# Patient Record
Sex: Female | Born: 1960 | Race: Black or African American | Hispanic: No | Marital: Single | Smoking: Never smoker
Health system: Southern US, Community
[De-identification: ages and names within clinical notes are randomized; demographics above are authoritative.]

## PROBLEM LIST (undated history)

## (undated) DIAGNOSIS — I10 Essential (primary) hypertension: Secondary | ICD-10-CM

## (undated) DIAGNOSIS — D649 Anemia, unspecified: Secondary | ICD-10-CM

## (undated) DIAGNOSIS — F99 Mental disorder, not otherwise specified: Secondary | ICD-10-CM

## (undated) DIAGNOSIS — R0602 Shortness of breath: Secondary | ICD-10-CM

## (undated) DIAGNOSIS — K219 Gastro-esophageal reflux disease without esophagitis: Secondary | ICD-10-CM

## (undated) DIAGNOSIS — N760 Acute vaginitis: Secondary | ICD-10-CM

## (undated) DIAGNOSIS — Z9289 Personal history of other medical treatment: Secondary | ICD-10-CM

## (undated) DIAGNOSIS — M1990 Unspecified osteoarthritis, unspecified site: Secondary | ICD-10-CM

## (undated) DIAGNOSIS — B9689 Other specified bacterial agents as the cause of diseases classified elsewhere: Secondary | ICD-10-CM

## (undated) DIAGNOSIS — F419 Anxiety disorder, unspecified: Secondary | ICD-10-CM

## (undated) DIAGNOSIS — R51 Headache: Secondary | ICD-10-CM

## (undated) HISTORY — DX: Unspecified osteoarthritis, unspecified site: M19.90

## (undated) HISTORY — PX: TUBAL LIGATION: SHX77

## (undated) HISTORY — PX: OTHER SURGICAL HISTORY: SHX169

## (undated) HISTORY — DX: Essential (primary) hypertension: I10

## (undated) HISTORY — DX: Headache: R51

## (undated) HISTORY — DX: Anemia, unspecified: D64.9

---

## 1997-11-03 ENCOUNTER — Emergency Department (HOSPITAL_COMMUNITY): Admission: EM | Admit: 1997-11-03 | Discharge: 1997-11-03 | Payer: Self-pay | Admitting: Family Medicine

## 2001-05-24 ENCOUNTER — Ambulatory Visit: Admission: RE | Admit: 2001-05-24 | Discharge: 2001-05-24 | Payer: Self-pay | Admitting: Family Medicine

## 2001-05-24 ENCOUNTER — Encounter: Payer: Self-pay | Admitting: Family Medicine

## 2006-08-26 ENCOUNTER — Encounter: Admission: RE | Admit: 2006-08-26 | Discharge: 2006-08-26 | Payer: Self-pay | Admitting: Emergency Medicine

## 2009-01-30 ENCOUNTER — Encounter: Admission: RE | Admit: 2009-01-30 | Discharge: 2009-01-30 | Payer: Self-pay | Admitting: Family Medicine

## 2010-03-30 ENCOUNTER — Encounter: Payer: Self-pay | Admitting: Emergency Medicine

## 2010-05-08 ENCOUNTER — Other Ambulatory Visit (HOSPITAL_COMMUNITY)
Admission: RE | Admit: 2010-05-08 | Discharge: 2010-05-08 | Disposition: A | Discharge status: Home / Self Care | Payer: PRIVATE HEALTH INSURANCE | Source: Ambulatory Visit | Attending: Obstetrics and Gynecology | Admitting: Obstetrics and Gynecology

## 2010-05-08 DIAGNOSIS — Z01419 Encounter for gynecological examination (general) (routine) without abnormal findings: Secondary | ICD-10-CM | POA: Insufficient documentation

## 2010-05-08 DIAGNOSIS — Z113 Encounter for screening for infections with a predominantly sexual mode of transmission: Secondary | ICD-10-CM | POA: Insufficient documentation

## 2011-03-06 ENCOUNTER — Other Ambulatory Visit: Payer: Self-pay | Admitting: Family Medicine

## 2011-03-08 ENCOUNTER — Encounter: Payer: Self-pay | Admitting: Family Medicine

## 2011-03-08 ENCOUNTER — Other Ambulatory Visit: Payer: Self-pay | Admitting: Family Medicine

## 2011-03-08 NOTE — H&P (Signed)
Patient is unable to tolerate oral iron, due to GI upset and hair loss. She has chronic iron deficiency anemia. Recent Hgb was 7.6 on 02/27/2011. Iron level was 16.

## 2011-03-13 ENCOUNTER — Encounter (HOSPITAL_COMMUNITY): Payer: PRIVATE HEALTH INSURANCE

## 2011-07-20 ENCOUNTER — Other Ambulatory Visit: Payer: Self-pay | Admitting: Family Medicine

## 2011-08-17 DIAGNOSIS — K219 Gastro-esophageal reflux disease without esophagitis: Secondary | ICD-10-CM | POA: Insufficient documentation

## 2011-08-17 DIAGNOSIS — I10 Essential (primary) hypertension: Secondary | ICD-10-CM | POA: Insufficient documentation

## 2011-09-07 DIAGNOSIS — Z9289 Personal history of other medical treatment: Secondary | ICD-10-CM

## 2011-09-07 HISTORY — DX: Personal history of other medical treatment: Z92.89

## 2011-09-14 ENCOUNTER — Encounter (HOSPITAL_COMMUNITY): Payer: Self-pay | Admitting: *Deleted

## 2011-09-14 ENCOUNTER — Emergency Department (HOSPITAL_COMMUNITY)
Admission: EM | Admit: 2011-09-14 | Discharge: 2011-09-15 | Disposition: A | Discharge status: Home / Self Care | Payer: PRIVATE HEALTH INSURANCE | Attending: Emergency Medicine | Admitting: Emergency Medicine

## 2011-09-14 DIAGNOSIS — D649 Anemia, unspecified: Secondary | ICD-10-CM | POA: Insufficient documentation

## 2011-09-14 DIAGNOSIS — I10 Essential (primary) hypertension: Secondary | ICD-10-CM | POA: Insufficient documentation

## 2011-09-14 DIAGNOSIS — R0602 Shortness of breath: Secondary | ICD-10-CM | POA: Insufficient documentation

## 2011-09-14 LAB — COMPREHENSIVE METABOLIC PANEL WITH GFR
ALT: 10 U/L (ref 0–35)
AST: 17 U/L (ref 0–37)
Albumin: 3.1 g/dL — ABNORMAL LOW (ref 3.5–5.2)
Alkaline Phosphatase: 132 U/L — ABNORMAL HIGH (ref 39–117)
BUN: 11 mg/dL (ref 6–23)
CO2: 25 meq/L (ref 19–32)
Calcium: 9.5 mg/dL (ref 8.4–10.5)
Chloride: 100 meq/L (ref 96–112)
Creatinine, Ser: 0.65 mg/dL (ref 0.50–1.10)
GFR calc Af Amer: >90 mL/min
GFR calc non Af Amer: >90 mL/min
Glucose, Bld: 98 mg/dL (ref 70–99)
Potassium: 3.8 meq/L (ref 3.5–5.1)
Sodium: 134 meq/L — ABNORMAL LOW (ref 135–145)
Total Bilirubin: 0.3 mg/dL (ref 0.3–1.2)
Total Protein: 8.1 g/dL (ref 6.0–8.3)

## 2011-09-14 LAB — CBC WITH DIFFERENTIAL/PLATELET
Basophils Absolute: 0 10*3/uL (ref 0.0–0.1)
Basophils Relative: 0 % (ref 0–1)
Eosinophils Absolute: 0.1 10*3/uL (ref 0.0–0.7)
Eosinophils Relative: 1 % (ref 0–5)
HCT: 24.3 % — ABNORMAL LOW (ref 36.0–46.0)
Hemoglobin: 6.8 g/dL — CL (ref 12.0–15.0)
Lymphocytes Relative: 24 % (ref 12–46)
Lymphs Abs: 2.1 10*3/uL (ref 0.7–4.0)
MCH: 18.3 pg — ABNORMAL LOW (ref 26.0–34.0)
MCHC: 28 g/dL — ABNORMAL LOW (ref 30.0–36.0)
MCV: 65.5 fL — ABNORMAL LOW (ref 78.0–100.0)
Monocytes Absolute: 0.5 10*3/uL (ref 0.1–1.0)
Monocytes Relative: 6 % (ref 3–12)
Neutro Abs: 6.2 10*3/uL (ref 1.7–7.7)
Neutrophils Relative %: 69 % (ref 43–77)
Platelets: 249 10*3/uL (ref 150–400)
RBC: 3.71 MIL/uL — ABNORMAL LOW (ref 3.87–5.11)
RDW: 19.5 % — ABNORMAL HIGH (ref 11.5–15.5)
WBC: 8.9 10*3/uL (ref 4.0–10.5)

## 2011-09-14 LAB — PREPARE RBC (CROSSMATCH)

## 2011-09-14 MED ORDER — SODIUM CHLORIDE 0.9 % IV SOLN
Freq: Once | INTRAVENOUS | Status: AC
  Administered 2011-09-14: 20:00:00 via INTRAVENOUS

## 2011-09-14 NOTE — ED Notes (Signed)
Pt had blood works last Wednesday at her PCP's office (Dr. Harvie Heck Harris)---results came in today and was told that her Hgb is 6.3--- was advised to come to ED for Blood Transfusion. Presents to room A/Ox4, c/o some dizziness and SOB,  No other complaints.

## 2011-09-14 NOTE — ED Notes (Addendum)
Pt states "Dr. Tiburcio Pea' nurse called & told me to come here for an iron transfusion and to stay 1 night, they told me my iron was 6.3"; called SS & spoken with ED Charge RN, no one aware.Marland Kitchen

## 2011-09-14 NOTE — ED Notes (Signed)
Pt denies itching or shortness of breath at this time.

## 2011-09-15 NOTE — ED Provider Notes (Addendum)
History     CSN: [id number]  Arrival date & time 09/14/11  1729   First MD Initiated Contact with Patient 09/14/11 1927      Chief Complaint  Patient presents with  . Shortness of Breath    sent here from Dr. Leonides Sake' office for iron transfusion and 1 night stay    (Consider location/radiation/quality/duration/timing/severity/associated sxs/prior treatment) HPI Pt has history of heavy menstrual bleeding.  She saw her doctor last Wednesday and had blood work.  Pt was treated by PCP with meds and the vaginal bleeding stopped.  She was called back today and was told that she was anemic and needed a blood transfusion.  Pt was sent to the ED for further evaluation.  She denies shortness of breath or chest pain.  She has felt fatigued. Past Medical History  Diagnosis Date  . Hypertension   . Headache   . Anemia   . Arthritis     Past Surgical History  Procedure Date  . C-section x 2     No family history on file.  History  Substance Use Topics  . Smoking status: Never Smoker   . Smokeless tobacco: Not on file  . Alcohol Use: No    OB History    Grav Para Term Preterm Abortions TAB SAB Ect Mult Living                  Review of Systems  All other systems reviewed and are negative.    Allergies  Review of patient's allergies indicates no known allergies.  Home Medications   Current Outpatient Rx  Name Route Sig Dispense Refill  . AMLODIPINE BESYLATE-VALSARTAN 10-320 MG PO TABS Oral Take 1 tablet by mouth daily.    . FEOSOL PO Oral Take 1 tablet by mouth daily.    . CYCLOBENZAPRINE HCL 10 MG PO TABS Oral Take 10 mg by mouth at bedtime as needed. For muscle pain.    Marland Kitchen ESOMEPRAZOLE MAGNESIUM 40 MG PO CPDR Oral Take 40 mg by mouth daily as needed.     . NORGESTREL-ETHINYL ESTRADIOL 0.3-30 MG-MCG PO TABS Oral Take 1 tablet by mouth daily.    . TRANEXAMIC ACID 650 MG PO TABS Oral Take 1,300 mg by mouth 3 (three) times daily.      BP 159/67  Pulse 87  Temp  98.3 F (36.8 C) (Oral)  Resp 20  Wt 347 lb (157.398 kg)  SpO2 100%  LMP 08/15/2011  Physical Exam  Nursing note and vitals reviewed. Constitutional: She appears well-developed and well-nourished. No distress.       Obese   HENT:  Head: Normocephalic and atraumatic.  Right Ear: External ear normal.  Left Ear: External ear normal.  Eyes: Conjunctivae are normal. Right eye exhibits no discharge. Left eye exhibits no discharge. No scleral icterus.       Conjunctiva pale  Neck: Neck supple. No tracheal deviation present.  Cardiovascular: Normal rate, regular rhythm and intact distal pulses.   Pulmonary/Chest: Effort normal and breath sounds normal. No stridor. No respiratory distress. She has no wheezes. She has no rales.  Abdominal: Soft. Bowel sounds are normal. She exhibits no distension. There is no tenderness. There is no rebound and no guarding.  Musculoskeletal: She exhibits no edema and no tenderness.  Neurological: She is alert. She has normal strength. No sensory deficit. Cranial nerve deficit:  no gross defecits noted. She exhibits normal muscle tone. She displays no seizure activity. Coordination normal.  Skin: Skin is  warm and dry. No rash noted.  Psychiatric: She has a normal mood and affect.    ED Course  Procedures (including critical care time)  Rate: 79  Rhythm: normal sinus rhythm  QRS Axis: normal  Intervals: normal  ST/T Wave abnormalities: normal  Conduction Disutrbances:none  Narrative Interpretation:   Old EKG Reviewed: none available  Labs Reviewed  CBC WITH DIFFERENTIAL - Abnormal; Notable for the following:    RBC 3.71 (*)     Hemoglobin 6.8 (*)     HCT 24.3 (*)     MCV 65.5 (*)     MCH 18.3 (*)     MCHC 28.0 (*)     RDW 19.5 (*)     All other components within normal limits  COMPREHENSIVE METABOLIC PANEL - Abnormal; Notable for the following:    Sodium 134 (*)     Albumin 3.1 (*)     Alkaline Phosphatase 132 (*)     All other components  within normal limits  TYPE AND SCREEN  ABO/RH  PREPARE RBC (CROSSMATCH)   No results found.   1. Anemia       MDM  Pt with anemia associated with menorrhagia which has now stopped after treatment by her PCP.  Pt transfused 2 units of blood in the ED.  Tolerated well.  Will dc home to follow up with her PCP.        Celene Kras, MD 09/15/11 Rich Fuchs  Celene Kras, MD 10/07/11 1924  Celene Kras, MD 10/21/11 [phone]

## 2011-09-16 LAB — TYPE AND SCREEN
ABO/RH(D): O POS
Antibody Screen: NEGATIVE
Unit division: 0
Unit division: 0

## 2011-09-18 ENCOUNTER — Inpatient Hospital Stay (HOSPITAL_COMMUNITY): Admission: RE | Admit: 2011-09-18 | Payer: PRIVATE HEALTH INSURANCE | Source: Ambulatory Visit

## 2011-11-10 IMAGING — US US TRANSVAGINAL NON-OB
1 series · 14 of 25 positions shown · non-contrast
Comparison: None.

CLINICAL DATA: Heavy menstrual bleeding

TRANSABDOMINAL AND TRANSVAGINAL ULTRASOUND OF PELVIS
TECHNIQUE: Both transabdominal and transvaginal ultrasound
examinations of the pelvis were performed including evaluation of
the uterus, ovaries, adnexal regions, and pelvic cul-de-sac.

[Series 1: us transvaginal non-ob · 0.30mm/px · 14 of 44 slices shown]
[im 1/44]
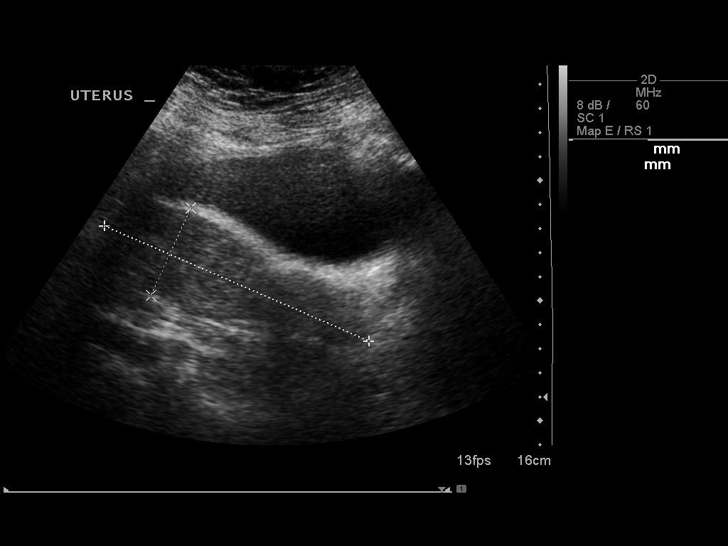
[im 4/44]
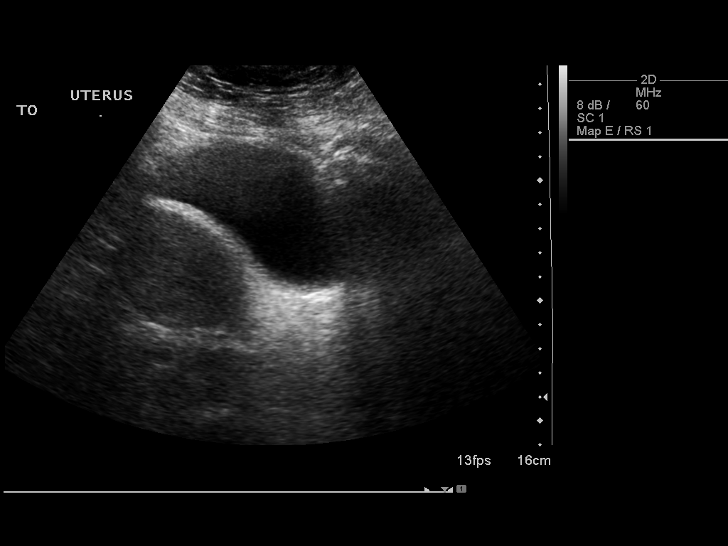
[im 8/44]
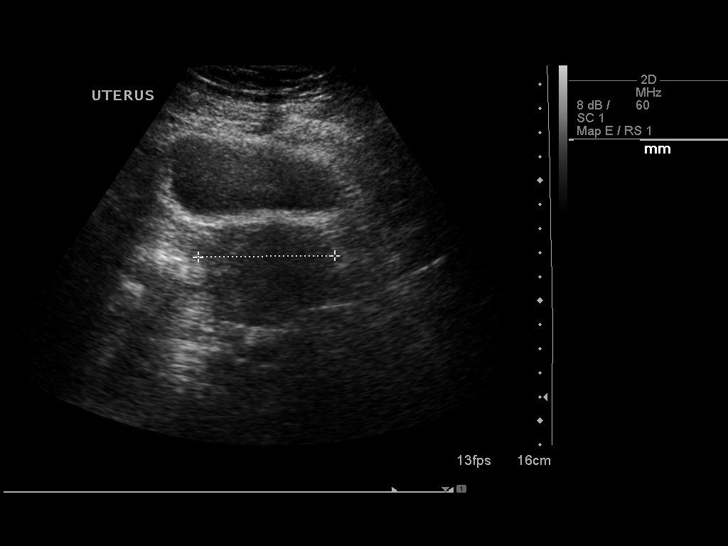
[im 11/44]
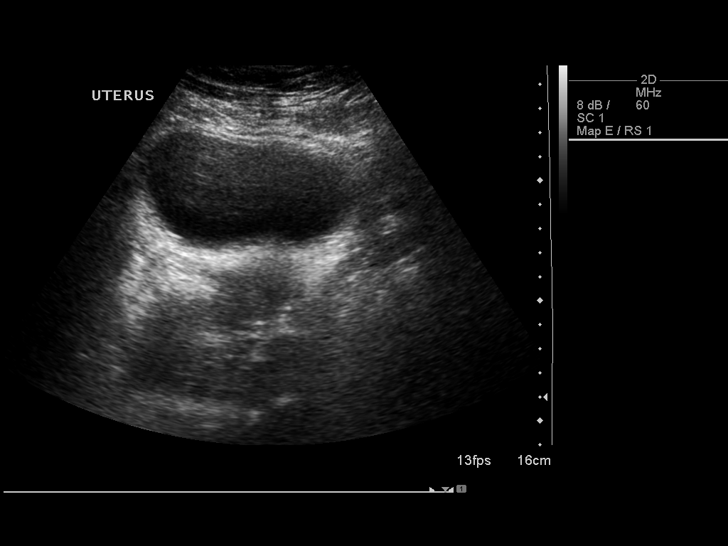
[im 15/44]
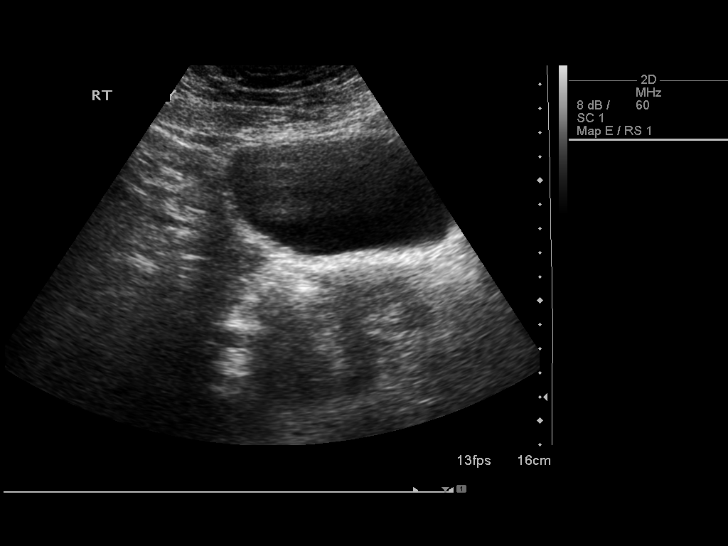
[im 17/44]
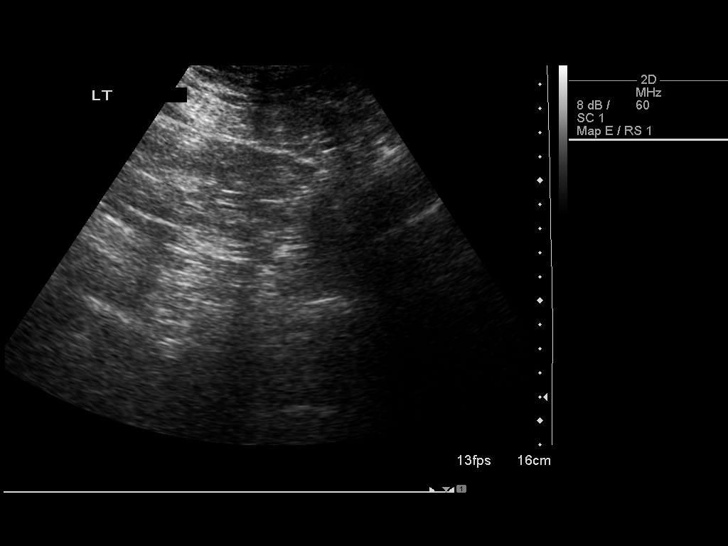
[im 20/44]
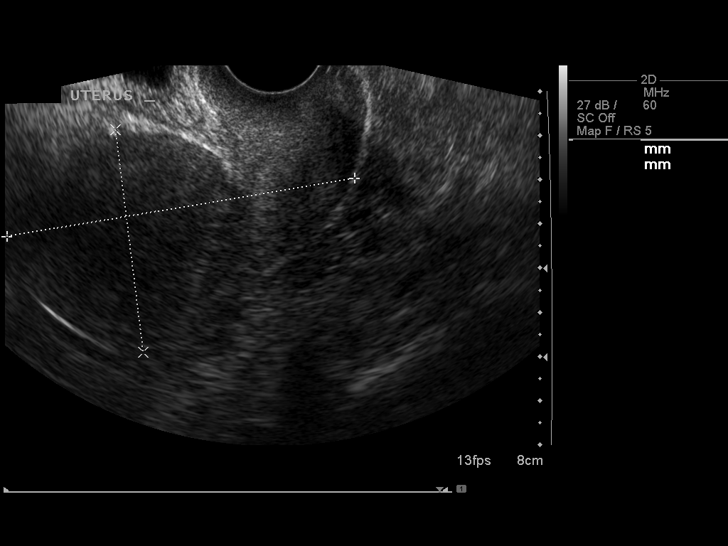
[im 24/44]
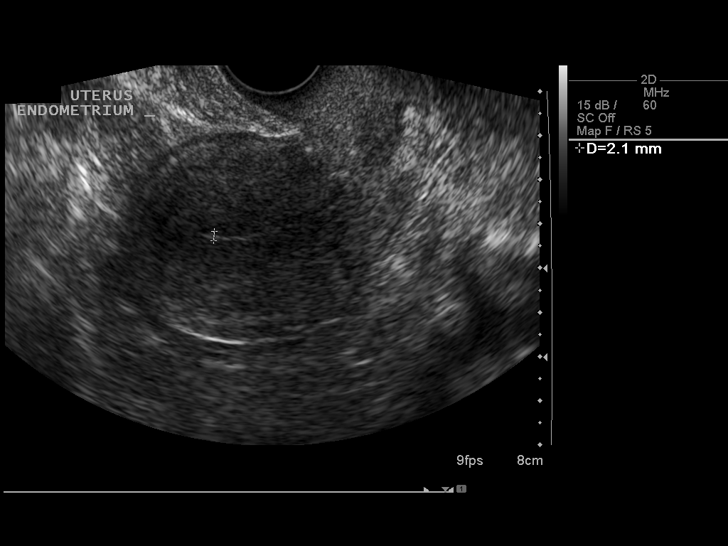
[im 27/44]
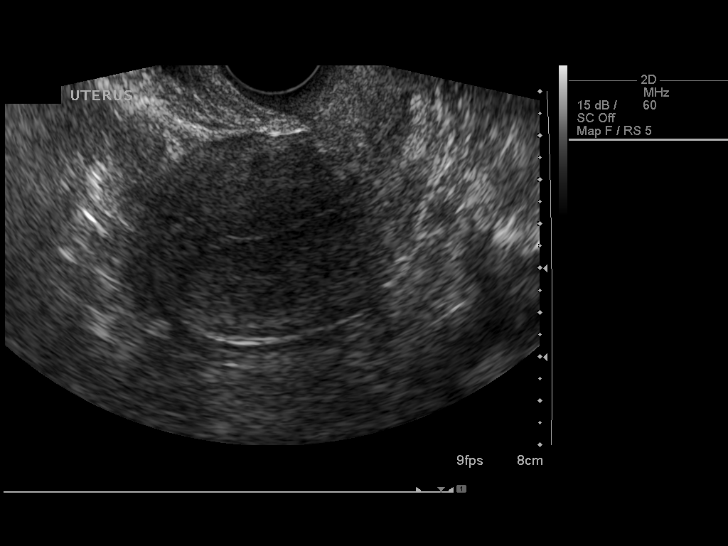
[im 29/44]
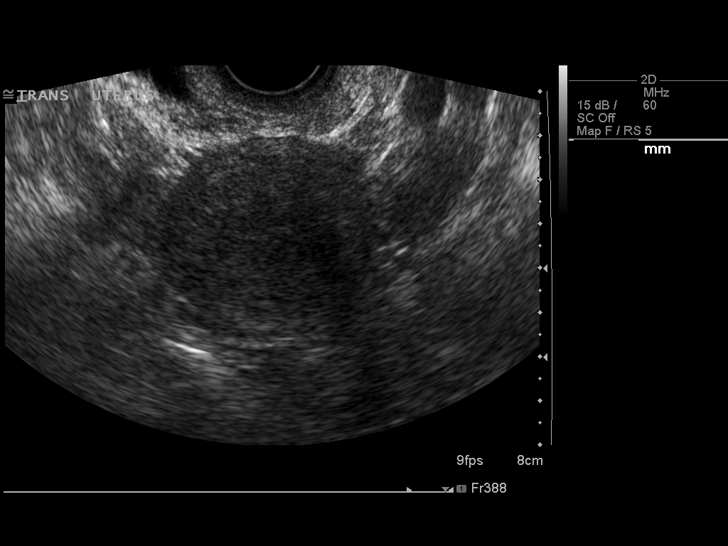
[im 33/44]
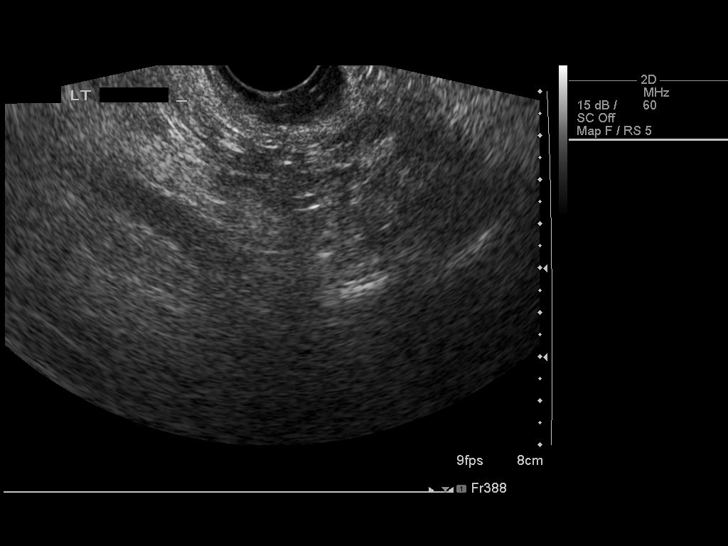
[im 36/44]
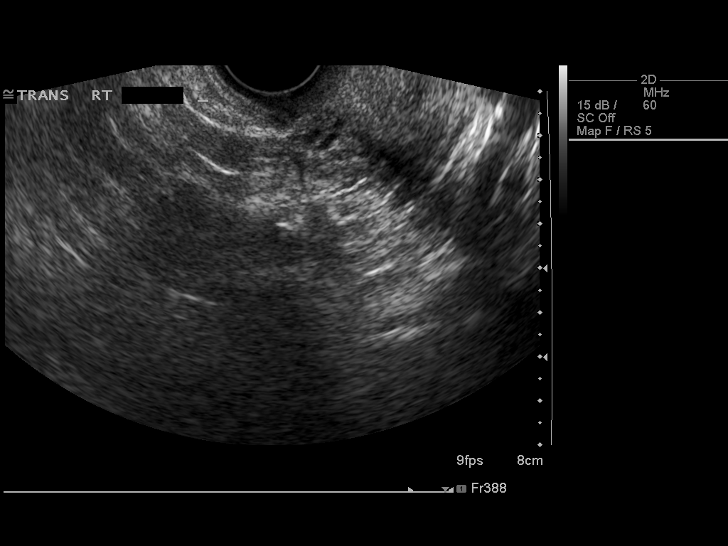
[im 40/44]
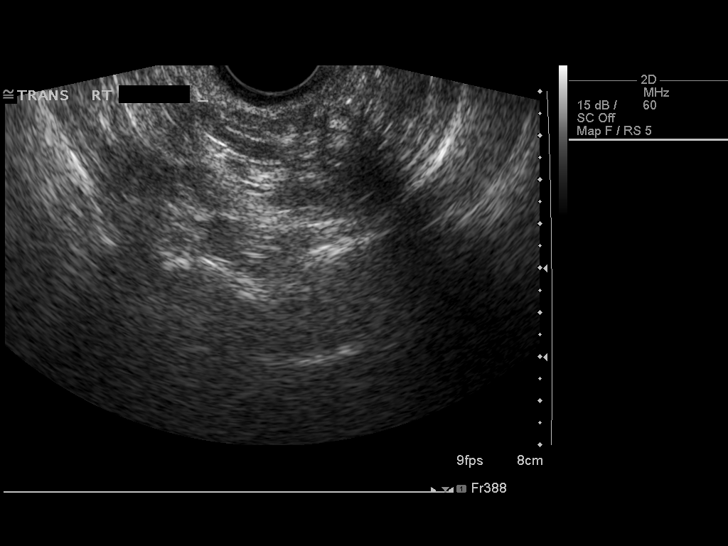
[im 44/44]
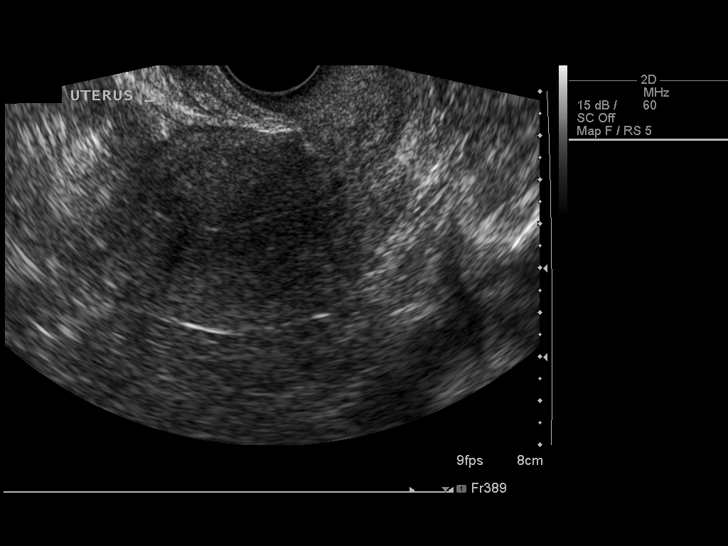

[14 of 25 positions shown; findings below may reference images not displayed]

FINDINGS: Uterus measures 12.0 x 4.0 x 5.7 cm.  Normal uterine size, shape
and position.  Mild heterogeneity of the uterine fundus roughly
measuring 2 cm suspicious for a small intramural fibroid.

Endometrium normal in appearance measuring 2 mm

Right Ovary only visualized transabdominally measuring 2.3 x 1.4 x
2.4 cm.

Left Ovary not visualize because of obscuring bowel gas

Other Findings:  No free fluid or adnexal abnormality.  Limited
exam because of patient body habitus.
IMPRESSION: Probable small intramural 2 cm fibroid in the uterine fundus
otherwise normal uterine shape and contour.

Normal endometrial thickness

Nonvisualization of the left ovary

## 2012-05-05 ENCOUNTER — Other Ambulatory Visit (HOSPITAL_COMMUNITY)
Admission: RE | Admit: 2012-05-05 | Discharge: 2012-05-05 | Disposition: A | Discharge status: Home / Self Care | Payer: PRIVATE HEALTH INSURANCE | Source: Ambulatory Visit | Attending: Obstetrics and Gynecology | Admitting: Obstetrics and Gynecology

## 2012-05-05 ENCOUNTER — Other Ambulatory Visit: Payer: Self-pay | Admitting: Obstetrics and Gynecology

## 2012-05-05 DIAGNOSIS — Z1151 Encounter for screening for human papillomavirus (HPV): Secondary | ICD-10-CM | POA: Insufficient documentation

## 2012-05-05 DIAGNOSIS — Z01419 Encounter for gynecological examination (general) (routine) without abnormal findings: Secondary | ICD-10-CM | POA: Insufficient documentation

## 2012-05-06 ENCOUNTER — Other Ambulatory Visit (HOSPITAL_COMMUNITY): Payer: Self-pay | Admitting: Obstetrics and Gynecology

## 2012-05-06 DIAGNOSIS — N939 Abnormal uterine and vaginal bleeding, unspecified: Secondary | ICD-10-CM

## 2012-05-10 ENCOUNTER — Other Ambulatory Visit (HOSPITAL_COMMUNITY): Payer: Self-pay | Admitting: Obstetrics and Gynecology

## 2012-05-10 ENCOUNTER — Ambulatory Visit (HOSPITAL_COMMUNITY)
Admission: RE | Admit: 2012-05-10 | Discharge: 2012-05-10 | Disposition: A | Discharge status: Home / Self Care | Payer: PRIVATE HEALTH INSURANCE | Source: Ambulatory Visit | Attending: Obstetrics and Gynecology | Admitting: Obstetrics and Gynecology

## 2012-05-10 DIAGNOSIS — N939 Abnormal uterine and vaginal bleeding, unspecified: Secondary | ICD-10-CM

## 2012-05-10 DIAGNOSIS — N949 Unspecified condition associated with female genital organs and menstrual cycle: Secondary | ICD-10-CM | POA: Insufficient documentation

## 2012-05-10 DIAGNOSIS — N938 Other specified abnormal uterine and vaginal bleeding: Secondary | ICD-10-CM | POA: Insufficient documentation

## 2012-05-10 DIAGNOSIS — D649 Anemia, unspecified: Secondary | ICD-10-CM | POA: Insufficient documentation

## 2012-05-24 ENCOUNTER — Encounter (HOSPITAL_COMMUNITY): Payer: Self-pay | Admitting: Pharmacist

## 2012-05-27 ENCOUNTER — Encounter (HOSPITAL_COMMUNITY): Payer: Self-pay

## 2012-05-27 ENCOUNTER — Other Ambulatory Visit: Payer: Self-pay

## 2012-05-27 ENCOUNTER — Encounter (HOSPITAL_COMMUNITY)
Admission: RE | Admit: 2012-05-27 | Discharge: 2012-05-27 | Disposition: A | Discharge status: Home / Self Care | Payer: PRIVATE HEALTH INSURANCE | Source: Ambulatory Visit | Attending: Obstetrics and Gynecology | Admitting: Obstetrics and Gynecology

## 2012-05-27 DIAGNOSIS — Z01818 Encounter for other preprocedural examination: Secondary | ICD-10-CM | POA: Insufficient documentation

## 2012-05-27 DIAGNOSIS — Z01812 Encounter for preprocedural laboratory examination: Secondary | ICD-10-CM | POA: Insufficient documentation

## 2012-05-27 HISTORY — DX: Shortness of breath: R06.02

## 2012-05-27 HISTORY — DX: Gastro-esophageal reflux disease without esophagitis: K21.9

## 2012-05-27 HISTORY — DX: Anxiety disorder, unspecified: F41.9

## 2012-05-27 HISTORY — DX: Mental disorder, not otherwise specified: F99

## 2012-05-27 HISTORY — DX: Other specified bacterial agents as the cause of diseases classified elsewhere: B96.89

## 2012-05-27 HISTORY — DX: Personal history of other medical treatment: Z92.89

## 2012-05-27 HISTORY — DX: Acute vaginitis: N76.0

## 2012-05-27 LAB — BASIC METABOLIC PANEL
BUN: 9 mg/dL (ref 6–23)
CO2: 27 mEq/L (ref 19–32)
Calcium: 9.4 mg/dL (ref 8.4–10.5)
Chloride: 101 mEq/L (ref 96–112)
Creatinine, Ser: 0.72 mg/dL (ref 0.50–1.10)
Glucose, Bld: 112 mg/dL — ABNORMAL HIGH (ref 70–99)

## 2012-05-27 LAB — CBC
HCT: 28.4 % — ABNORMAL LOW (ref 36.0–46.0)
MCH: 18.1 pg — ABNORMAL LOW (ref 26.0–34.0)
MCHC: 27.1 g/dL — ABNORMAL LOW (ref 30.0–36.0)
MCV: 66.7 fL — ABNORMAL LOW (ref 78.0–100.0)
Platelets: 273 10*3/uL (ref 150–400)
RDW: 20.3 % — ABNORMAL HIGH (ref 11.5–15.5)

## 2012-05-27 NOTE — Pre-Procedure Instructions (Signed)
Dr Rodman Pickle informed that patient's Hgb is 7.7 today at PAT appt.  No orders given.

## 2012-05-27 NOTE — Pre-Procedure Instructions (Signed)
Patient's Bp with large cuff 193/125.  Dr Rodman Pickle informed.  Patient needs surgical clearance from Dr Tiburcio Pea because of BP.  Myrene at Dr Dawayne Patricia office informed and appt made with Dr Tiburcio Pea on Monday, 05/30/12.  Patient informed of appt date and time.  Patient has a hsitory of blood transfusion in 09/2011 at [facility] for 2 Units - SDS BB History Log submitted to [facility].

## 2012-05-27 NOTE — Patient Instructions (Addendum)
   Your procedure is scheduled on: Friday, March 28th  Enter through the Main Entrance of [facility] at: 945 am Pick up the phone at the desk and dial [phone] and inform us of your arrival.  Please call this number if you have any problems the morning of surgery: [phone]  Remember: Do not eat after 3 am Friday Morning DOS - clears til 7am Friday DOS Take these medicines the morning of surgery with a SIP OF WATER: nexium, bp med    Do not wear jewelry, make-up, or FINGER nail polish No metal in your hair or on your body. Do not wear lotions, powders, perfumes. You may wear deodorant.  Please use your CHG wash as directed prior to surgery.  Do not shave anywhere for at least 12 hours prior to first CHG shower.  Do not bring valuables to the hospital. Contacts, dentures or bridgework may not be worn into surgery.  Patients discharged on the day of surgery will not be allowed to drive home.  Home with mother Cheron Coryell.

## 2012-06-03 ENCOUNTER — Ambulatory Visit (HOSPITAL_COMMUNITY)
Admission: RE | Admit: 2012-06-03 | Payer: PRIVATE HEALTH INSURANCE | Source: Ambulatory Visit | Admitting: Obstetrics and Gynecology

## 2012-06-03 ENCOUNTER — Encounter (HOSPITAL_COMMUNITY): Admission: RE | Payer: Self-pay | Source: Ambulatory Visit

## 2012-06-03 SURGERY — DILATATION & CURETTAGE/HYSTEROSCOPY WITH HYDROTHERMAL ABLATION
Anesthesia: Choice

## 2014-01-16 ENCOUNTER — Other Ambulatory Visit: Payer: Self-pay | Admitting: Family Medicine

## 2014-01-22 ENCOUNTER — Inpatient Hospital Stay (HOSPITAL_COMMUNITY): Admission: RE | Admit: 2014-01-22 | Payer: PRIVATE HEALTH INSURANCE | Source: Ambulatory Visit

## 2014-01-26 ENCOUNTER — Ambulatory Visit (HOSPITAL_COMMUNITY)
Admission: RE | Admit: 2014-01-26 | Discharge: 2014-01-26 | Disposition: A | Discharge status: Home / Self Care | Payer: PRIVATE HEALTH INSURANCE | Source: Ambulatory Visit | Attending: Family Medicine | Admitting: Family Medicine

## 2014-01-26 DIAGNOSIS — D649 Anemia, unspecified: Secondary | ICD-10-CM | POA: Diagnosis not present

## 2014-01-26 MED ORDER — FERUMOXYTOL INJECTION 510 MG/17 ML
510.0000 mg | Freq: Once | INTRAVENOUS | Status: AC
  Administered 2014-01-26: 510 mg via INTRAVENOUS
  Filled 2014-01-26: qty 17

## 2014-04-03 ENCOUNTER — Observation Stay (HOSPITAL_COMMUNITY)
Admission: EM | Admit: 2014-04-03 | Discharge: 2014-04-04 | Disposition: A | Discharge status: Home / Self Care | Payer: PRIVATE HEALTH INSURANCE | Attending: Internal Medicine | Admitting: Internal Medicine

## 2014-04-03 ENCOUNTER — Observation Stay (HOSPITAL_COMMUNITY): Payer: PRIVATE HEALTH INSURANCE

## 2014-04-03 ENCOUNTER — Encounter (HOSPITAL_COMMUNITY): Payer: Self-pay | Admitting: Emergency Medicine

## 2014-04-03 DIAGNOSIS — K219 Gastro-esophageal reflux disease without esophagitis: Secondary | ICD-10-CM | POA: Diagnosis not present

## 2014-04-03 DIAGNOSIS — I10 Essential (primary) hypertension: Secondary | ICD-10-CM | POA: Diagnosis not present

## 2014-04-03 DIAGNOSIS — N920 Excessive and frequent menstruation with regular cycle: Secondary | ICD-10-CM | POA: Insufficient documentation

## 2014-04-03 DIAGNOSIS — D539 Nutritional anemia, unspecified: Secondary | ICD-10-CM | POA: Diagnosis not present

## 2014-04-03 DIAGNOSIS — F419 Anxiety disorder, unspecified: Secondary | ICD-10-CM | POA: Diagnosis not present

## 2014-04-03 DIAGNOSIS — J45909 Unspecified asthma, uncomplicated: Secondary | ICD-10-CM | POA: Insufficient documentation

## 2014-04-03 DIAGNOSIS — I471 Supraventricular tachycardia: Secondary | ICD-10-CM | POA: Diagnosis not present

## 2014-04-03 DIAGNOSIS — R0600 Dyspnea, unspecified: Secondary | ICD-10-CM | POA: Insufficient documentation

## 2014-04-03 DIAGNOSIS — Z6841 Body Mass Index (BMI) 40.0 and over, adult: Secondary | ICD-10-CM | POA: Diagnosis not present

## 2014-04-03 DIAGNOSIS — D649 Anemia, unspecified: Secondary | ICD-10-CM | POA: Diagnosis present

## 2014-04-03 DIAGNOSIS — D509 Iron deficiency anemia, unspecified: Secondary | ICD-10-CM | POA: Diagnosis not present

## 2014-04-03 DIAGNOSIS — M1389 Other specified arthritis, multiple sites: Secondary | ICD-10-CM | POA: Insufficient documentation

## 2014-04-03 LAB — BASIC METABOLIC PANEL
ANION GAP: 6 (ref 5–15)
BUN: 12 mg/dL (ref 6–23)
CO2: 28 mmol/L (ref 19–32)
CREATININE: 0.71 mg/dL (ref 0.50–1.10)
Calcium: 8.8 mg/dL (ref 8.4–10.5)
Chloride: 106 mmol/L (ref 96–112)
GFR calc non Af Amer: >90 mL/min (ref >90)
GLUCOSE: 122 mg/dL — AB (ref 70–99)
POTASSIUM: 3.9 mmol/L (ref 3.5–5.1)
Sodium: 140 mmol/L (ref 135–145)

## 2014-04-03 LAB — HEPATIC FUNCTION PANEL
ALT: 17 U/L (ref 0–35)
AST: 18 U/L (ref 0–37)
Albumin: 3.3 g/dL — ABNORMAL LOW (ref 3.5–5.2)
Alkaline Phosphatase: 143 U/L — ABNORMAL HIGH (ref 39–117)
Bilirubin, Direct: <0.1 mg/dL (ref 0.0–0.5)
Total Bilirubin: 0.4 mg/dL (ref 0.3–1.2)
Total Protein: 7.9 g/dL (ref 6.0–8.3)

## 2014-04-03 LAB — CBC WITH DIFFERENTIAL/PLATELET
BASOS ABS: 0 10*3/uL (ref 0.0–0.1)
BASOS PCT: 0 % (ref 0–1)
EOS ABS: 0.1 10*3/uL (ref 0.0–0.7)
EOS PCT: 1 % (ref 0–5)
HEMATOCRIT: 23.2 % — AB (ref 36.0–46.0)
Hemoglobin: 6.3 g/dL — CL (ref 12.0–15.0)
LYMPHS ABS: 1.8 10*3/uL (ref 0.7–4.0)
LYMPHS PCT: 32 % (ref 12–46)
MCH: 19 pg — ABNORMAL LOW (ref 26.0–34.0)
MCHC: 27.2 g/dL — ABNORMAL LOW (ref 30.0–36.0)
MCV: 70.1 fL — AB (ref 78.0–100.0)
MONOS PCT: 6 % (ref 3–12)
Monocytes Absolute: 0.3 10*3/uL (ref 0.1–1.0)
NEUTROS ABS: 3.3 10*3/uL (ref 1.7–7.7)
NEUTROS PCT: 61 % (ref 43–77)
PLATELETS: 229 10*3/uL (ref 150–400)
RBC: 3.31 MIL/uL — ABNORMAL LOW (ref 3.87–5.11)
RDW: 20.7 % — AB (ref 11.5–15.5)
WBC: 5.5 10*3/uL (ref 4.0–10.5)

## 2014-04-03 LAB — PREPARE RBC (CROSSMATCH)

## 2014-04-03 LAB — CBC
HEMATOCRIT: 24.1 % — AB (ref 36.0–46.0)
Hemoglobin: 6.5 g/dL — CL (ref 12.0–15.0)
MCH: 19 pg — ABNORMAL LOW (ref 26.0–34.0)
MCHC: 27 g/dL — ABNORMAL LOW (ref 30.0–36.0)
MCV: 70.3 fL — ABNORMAL LOW (ref 78.0–100.0)
Platelets: 249 10*3/uL (ref 150–400)
RBC: 3.43 MIL/uL — AB (ref 3.87–5.11)
RDW: 20.6 % — ABNORMAL HIGH (ref 11.5–15.5)
WBC: 6.1 10*3/uL (ref 4.0–10.5)

## 2014-04-03 LAB — VITAMIN B12: Vitamin B-12: 395 pg/mL (ref 211–911)

## 2014-04-03 LAB — RETICULOCYTES
RBC.: 3.43 MIL/uL — ABNORMAL LOW (ref 3.87–5.11)
RETIC COUNT ABSOLUTE: 51.5 10*3/uL (ref 19.0–186.0)
Retic Ct Pct: 1.5 % (ref 0.4–3.1)

## 2014-04-03 LAB — MAGNESIUM: Magnesium: 2.1 mg/dL (ref 1.5–2.5)

## 2014-04-03 LAB — IRON AND TIBC
Iron: <10 ug/dL — ABNORMAL LOW (ref 42–145)
UIBC: 369 ug/dL (ref 125–400)

## 2014-04-03 LAB — FERRITIN: Ferritin: 6 ng/mL — ABNORMAL LOW (ref 10–291)

## 2014-04-03 LAB — TROPONIN I: Troponin I: <0.03 ng/mL (ref <0.031)

## 2014-04-03 LAB — TSH: TSH: 1.14 u[IU]/mL (ref 0.350–4.500)

## 2014-04-03 LAB — PROTIME-INR
INR: 0.88 (ref 0.00–1.49)
PROTHROMBIN TIME: 12 s (ref 11.6–15.2)

## 2014-04-03 LAB — FOLATE: Folate: 11 ng/mL

## 2014-04-03 MED ORDER — FERROUS SULFATE 325 (65 FE) MG PO TABS
325.0000 mg | ORAL_TABLET | Freq: Two times a day (BID) | ORAL | Status: DC
Start: 2014-04-03 — End: 2014-04-04
  Administered 2014-04-03 – 2014-04-04 (×3): 325 mg via ORAL
  Filled 2014-04-03 (×4): qty 1

## 2014-04-03 MED ORDER — LEVALBUTEROL HCL 0.63 MG/3ML IN NEBU: 0.6300 mg | INHALATION_SOLUTION | Freq: Four times a day (QID) | RESPIRATORY_TRACT | Status: DC | PRN

## 2014-04-03 MED ORDER — HYDRALAZINE HCL 20 MG/ML IJ SOLN
INTRAMUSCULAR | Status: AC
  Filled 2014-04-03: qty 2

## 2014-04-03 MED ORDER — ALBUTEROL SULFATE (2.5 MG/3ML) 0.083% IN NEBU: 2.5000 mg | INHALATION_SOLUTION | Freq: Four times a day (QID) | RESPIRATORY_TRACT | Status: DC | PRN

## 2014-04-03 MED ORDER — SODIUM CHLORIDE 0.9 % IJ SOLN: 3.0000 mL | Freq: Two times a day (BID) | INTRAMUSCULAR | Status: DC

## 2014-04-03 MED ORDER — ACETAMINOPHEN 650 MG RE SUPP: 650.0000 mg | Freq: Four times a day (QID) | RECTAL | Status: DC | PRN

## 2014-04-03 MED ORDER — DOCUSATE SODIUM 100 MG PO CAPS
100.0000 mg | ORAL_CAPSULE | Freq: Two times a day (BID) | ORAL | Status: DC
  Administered 2014-04-03 – 2014-04-04 (×2): 100 mg via ORAL
  Filled 2014-04-03 (×3): qty 1

## 2014-04-03 MED ORDER — AMLODIPINE BESYLATE 10 MG PO TABS
10.0000 mg | ORAL_TABLET | Freq: Every day | ORAL | Status: DC
  Administered 2014-04-03 – 2014-04-04 (×2): 10 mg via ORAL
  Filled 2014-04-03 (×2): qty 1

## 2014-04-03 MED ORDER — SODIUM CHLORIDE 0.9 % IV SOLN
Freq: Once | INTRAVENOUS | Status: AC
  Administered 2014-04-03: 16:00:00 via INTRAVENOUS

## 2014-04-03 MED ORDER — AMLODIPINE BESYLATE-VALSARTAN 10-320 MG PO TABS: 1.0000 | ORAL_TABLET | Freq: Every day | ORAL | Status: DC

## 2014-04-03 MED ORDER — BUDESONIDE-FORMOTEROL FUMARATE 160-4.5 MCG/ACT IN AERO
1.0000 | INHALATION_SPRAY | Freq: Two times a day (BID) | RESPIRATORY_TRACT | Status: DC
  Administered 2014-04-03 – 2014-04-04 (×2): 1 via RESPIRATORY_TRACT
  Filled 2014-04-03: qty 6

## 2014-04-03 MED ORDER — ONDANSETRON HCL 4 MG/2ML IJ SOLN: 4.0000 mg | Freq: Four times a day (QID) | INTRAMUSCULAR | Status: DC | PRN

## 2014-04-03 MED ORDER — HYDRALAZINE HCL 20 MG/ML IJ SOLN
10.0000 mg | Freq: Four times a day (QID) | INTRAMUSCULAR | Status: DC | PRN
  Administered 2014-04-03: 10 mg via INTRAVENOUS

## 2014-04-03 MED ORDER — POLYSACCHARIDE IRON COMPLEX 150 MG PO CAPS
150.0000 mg | ORAL_CAPSULE | Freq: Every day | ORAL | Status: DC
  Administered 2014-04-03 – 2014-04-04 (×2): 150 mg via ORAL
  Filled 2014-04-03 (×2): qty 1

## 2014-04-03 MED ORDER — SODIUM CHLORIDE 0.9 % IV SOLN: INTRAVENOUS | Status: DC

## 2014-04-03 MED ORDER — ACETAMINOPHEN 325 MG PO TABS: 650.0000 mg | ORAL_TABLET | Freq: Four times a day (QID) | ORAL | Status: DC | PRN

## 2014-04-03 MED ORDER — IRBESARTAN 300 MG PO TABS
300.0000 mg | ORAL_TABLET | Freq: Every day | ORAL | Status: DC
  Administered 2014-04-03 – 2014-04-04 (×2): 300 mg via ORAL
  Filled 2014-04-03 (×2): qty 1

## 2014-04-03 MED ORDER — TRAZODONE HCL 50 MG PO TABS
50.0000 mg | ORAL_TABLET | Freq: Every day | ORAL | Status: DC
  Filled 2014-04-03 (×2): qty 1

## 2014-04-03 MED ORDER — SODIUM CHLORIDE 0.9 % IJ SOLN: 3.0000 mL | INTRAMUSCULAR | Status: DC | PRN

## 2014-04-03 MED ORDER — PANTOPRAZOLE SODIUM 40 MG PO TBEC
40.0000 mg | DELAYED_RELEASE_TABLET | Freq: Every day | ORAL | Status: DC
  Administered 2014-04-04: 40 mg via ORAL
  Filled 2014-04-03: qty 1

## 2014-04-03 MED ORDER — SODIUM CHLORIDE 0.9 % IJ SOLN
3.0000 mL | Freq: Two times a day (BID) | INTRAMUSCULAR | Status: DC
  Administered 2014-04-03: 3 mL via INTRAVENOUS

## 2014-04-03 MED ORDER — ONDANSETRON HCL 4 MG PO TABS: 4.0000 mg | ORAL_TABLET | Freq: Four times a day (QID) | ORAL | Status: DC | PRN

## 2014-04-03 MED ORDER — HYDROCHLOROTHIAZIDE 25 MG PO TABS
25.0000 mg | ORAL_TABLET | Freq: Every day | ORAL | Status: DC
  Administered 2014-04-03 – 2014-04-04 (×2): 25 mg via ORAL
  Filled 2014-04-03 (×3): qty 1

## 2014-04-03 MED ORDER — SODIUM CHLORIDE 0.9 % IV SOLN: 250.0000 mL | INTRAVENOUS | Status: DC | PRN

## 2014-04-03 MED ORDER — SENNOSIDES-DOCUSATE SODIUM 8.6-50 MG PO TABS
2.0000 | ORAL_TABLET | Freq: Every day | ORAL | Status: DC
  Filled 2014-04-03: qty 2

## 2014-04-03 NOTE — Progress Notes (Signed)
CRITICAL VALUE ALERT  Critical value received:  Hgb 6.5  Date of notification:  04/03/2014  Time of notification:  1600  Critical value read back:Yes.    Nurse who received alert:  Ernst BreachMarissa Shyrl Obi RN   MD notified (1st page):  Dr. Susie CassetteAbrol  Time of first page:  1608  MD notified (2nd page):  Time of second page:  Responding MD:  Dr Susie CassetteAbrol  Time MD responded:  1640

## 2014-04-03 NOTE — ED Notes (Signed)
20 min start as of 1427. Spoke with Shanda BumpsJessica

## 2014-04-03 NOTE — ED Notes (Signed)
Pt sent from Dr. Romeo AppleHarrison office for low Hgb. Pt states was told its 6.8.

## 2014-04-03 NOTE — H&P (Signed)
[facility] History and Physical  Beth Pierce WUJ:[id number]:[id number] DOB: [birth year] DOA: 04/03/2014  Referring physician:   PCP: Johny BlamerHARRIS, WILLIAM, MD   Chief Complaint:  HPI:  54 year old female with a history of anemia , baseline hemoglobin of 7.0, sent to the ED because of anemia and further workup. Patient has a history of uterine fibroids and heavy menstrual periods with ongoing bleeding currently been no history of melena or hematochezia. No recent weight loss. She has had a colonoscopy. Denies any chest pain or shortness of breath. Patient feels that she has seen Dr. Richardson Doppole, she was on ovral for slow down her bleeding 2 months ago. She had a biopsy  Hemoglobin verified a 6.8. Patient ordered to have 2 units of present blood cells.    Review of Systems: negative for the following  Constitutional: Denies fever, chills, diaphoresis, appetite change and fatigue.  HEENT: Denies photophobia, eye pain, redness, hearing loss, ear pain, congestion, sore throat, rhinorrhea, sneezing, mouth sores, trouble swallowing, neck pain, neck stiffness and tinnitus.  Respiratory: Positive for SOB, DOE, cough, chest tightness, and wheezing.  Cardiovascular: Denies chest pain, palpitations and leg swelling.  Gastrointestinal: Denies nausea, vomiting, abdominal pain, diarrhea, constipation, blood in stool and abdominal distention.  Genitourinary: Denies dysuria, urgency, frequency, hematuria, flank pain and difficulty urinating.  Musculoskeletal: Denies myalgias, back pain, joint swelling, arthralgias and gait problem.  Skin: Denies pallor, rash and wound.  Neurological: Positive for dizziness, seizures, syncope, weakness, light-headedness, numbness and headaches.  Hematological: Denies adenopathy. Easy bruising, menorrhagia   Psychiatric/Behavioral: Denies suicidal ideation, mood changes, confusion, nervousness, sleep disturbance and agitation       Past Medical History  Diagnosis Date  .  Hypertension   . Anemia   . Arthritis     knees, hands  . GERD (gastroesophageal reflux disease)   . Bacterial vaginosis   . Mental disorder   . Anxiety   . Shortness of breath     w/cold weather - used inhaler prn  . Headache(784.0)     otc med prn  . History of blood transfusion 09/2011    [facility]     Past Surgical History  Procedure Laterality Date  . C-section x 2    . Tubal ligation        Social History:  reports that she has never smoked. She has never used smokeless tobacco. She reports that she does not drink alcohol or use illicit drugs.    No Known Allergies    Family History reviewed completely and found to be negative                   Prior to Admission medications   Medication Sig Start Date End Date Taking? Authorizing Provider  albuterol (PROVENTIL HFA;VENTOLIN HFA) 108 (90 BASE) MCG/ACT inhaler Inhale 2 puffs into the lungs every 6 (six) hours as needed for wheezing.   Yes Historical Provider, MD  amLODipine-valsartan (EXFORGE) 10-320 MG per tablet Take 1 tablet by mouth daily.   Yes Historical Provider, MD  budesonide-formoterol (SYMBICORT) 160-4.5 MCG/ACT inhaler Inhale 1 puff into the lungs every 6 (six) hours as needed (for shortness of breath).   Yes Historical Provider, MD  esomeprazole (NEXIUM) 40 MG capsule Take 40 mg by mouth daily.    Yes Historical Provider, MD  ferrous sulfate 325 (65 FE) MG tablet Take 325 mg by mouth 2 (two) times daily with a meal.   Yes Historical Provider, MD  hydrochlorothiazide (HYDRODIURIL) 25 MG tablet  Take 25 mg by mouth daily.   Yes Historical Provider, MD  iron polysaccharides (NIFEREX) 150 MG capsule Take 150 mg by mouth daily.   Yes Historical Provider, MD  traZODone (DESYREL) 50 MG tablet Take 50 mg by mouth at bedtime.   Yes Historical Provider, MD  Carbonyl Iron (FEOSOL PO) Take 1 tablet by mouth daily.    Historical Provider, MD  cyclobenzaprine (FLEXERIL) 10 MG tablet Take 10 mg by mouth at bedtime  as needed. For muscle pain.    Historical Provider, MD  norgestrel-ethinyl estradiol (LO/OVRAL,CRYSELLE) 0.3-30 MG-MCG tablet Take 1 tablet by mouth daily.    Historical Provider, MD  tinidazole (TINDAMAX) 500 MG tablet Take 500 mg by mouth daily with breakfast.    Historical Provider, MD  tranexamic acid (LYSTEDA) 650 MG TABS Take 1,300 mg by mouth 3 (three) times daily.    Historical Provider, MD     Physical Exam: There were no vitals filed for this visit.   Constitutional: Vital signs reviewed. Patient is a well-developed and well-nourished in no acute distress and cooperative with exam. Alert and oriented x3.  Head: Normocephalic and atraumatic , pale conjunctival Ear: TM normal bilaterally  Mouth: no erythema or exudates, dry mucous membranes, Eyes: PERRL, EOMI, conjunctivae normal, No scleral icterus.  Neck: Supple, Trachea midline normal ROM, No JVD, mass, thyromegaly, or carotid bruit present.  Cardiovascular: RRR, S1 normal, S2 normal, no MRG, pulses symmetric and intact bilaterally  Pulmonary/Chest: CTAB, no wheezes, rales, or rhonchi  Abdominal: Soft. Non-tender, non-distended, bowel sounds are normal, no masses, organomegaly, or guarding present.  GU: no CVA tenderness Musculoskeletal: No joint deformities, erythema, or stiffness, ROM full and no nontender Ext: no edema and no cyanosis, pulses palpable bilaterally (DP and PT)  Hematology: no cervical, inginal, or axillary adenopathy.  Neurological: A&O x3, Strenght is normal and symmetric bilaterally, cranial nerve II-XII are grossly intact, no focal motor deficit, sensory intact to light touch bilaterally.  Skin: Warm, dry and intact. No rash, cyanosis, or clubbing.  Psychiatric: Normal mood and affect. speech and behavior is normal. Judgment and thought content normal. Cognition and memory are normal.       Labs on Admission:    Basic Metabolic Panel:  Recent Labs Lab 04/03/14 1053  NA 140  K 3.9  CL 106  CO2  28  GLUCOSE 122*  BUN 12  CREATININE 0.71  CALCIUM 8.8   Liver Function Tests: No results for input(s): AST, ALT, ALKPHOS, BILITOT, PROT, ALBUMIN in the last 168 hours. No results for input(s): LIPASE, AMYLASE in the last 168 hours. No results for input(s): AMMONIA in the last 168 hours. CBC:  Recent Labs Lab 04/03/14 1053  WBC 5.5  NEUTROABS 3.3  HGB 6.3*  HCT 23.2*  MCV 70.1*  PLT 229   Cardiac Enzymes: No results for input(s): CKTOTAL, CKMB, CKMBINDEX, TROPONINI in the last 168 hours.  BNP (last 3 results) No results for input(s): PROBNP in the last 8760 hours.    CBG: No results for input(s): GLUCAP in the last 168 hours.  Radiological Exams on Admission: No results found.  EKG: Independently reviewed. *   Assessment/Plan Active Problems:   Anemia   Iron deficiency anemia/microcytic anemia We'll transfuse 2 units of packed red blood cells Continue ferrous sulfate Continue Nexium Patient would need age-appropriate screening Pelvic ultrasound to evaluate for fibroid   Hypertension Continue Exforge and HCTZ  Asthma Continue Symbicort and albuterol as needed   Code Status:   full Family  Communication: bedside Disposition Plan: admit   Time spent: 70 mins   [facility] Pager [phone]  If 7PM-7AM, please contact night-coverage [email or web address] Password [email or web address] 04/03/2014, 12:23 PM

## 2014-04-03 NOTE — ED Provider Notes (Signed)
CSN: [id number]     Arrival date & time 04/03/14  1006 History   First MD Initiated Contact with Patient 04/03/14 1021     Chief Complaint  Patient presents with  . Abnormal Lab    sent by PCP for low Hgb     (Consider location/radiation/quality/duration/timing/severity/associated sxs/prior Treatment) HPI Comments: Patient here complaining of weakness and low hemoglobin. Patient saw her Dr. yesterday for similar symptoms was found to have a hemoglobin of 6.8. She has a history of anemia secondary to heavy periods and she is on her menstrual cycle currently. Denies any black or bloody stools. No abdominal pain. No vomiting. Does note some dyspnea without anginal chest pain. Was sent to for blood transfusion.  The history is provided by the patient.    Past Medical History  Diagnosis Date  . Hypertension   . Anemia   . Arthritis     knees, hands  . GERD (gastroesophageal reflux disease)   . Bacterial vaginosis   . Mental disorder   . Anxiety   . Shortness of breath     w/cold weather - used inhaler prn  . Headache(784.0)     otc med prn  . History of blood transfusion 09/2011    [facility]   Past Surgical History  Procedure Laterality Date  . C-section x 2    . Tubal ligation     History reviewed. No pertinent family history. History  Substance Use Topics  . Smoking status: Never Smoker   . Smokeless tobacco: Never Used  . Alcohol Use: No   OB History    No data available     Review of Systems  All other systems reviewed and are negative.     Allergies  Review of patient's allergies indicates no known allergies.  Home Medications   Prior to Admission medications   Medication Sig Start Date End Date Taking? Authorizing Provider  albuterol (PROVENTIL HFA;VENTOLIN HFA) 108 (90 BASE) MCG/ACT inhaler Inhale 2 puffs into the lungs every 6 (six) hours as needed for wheezing.    Historical Provider, MD  amLODipine-valsartan (EXFORGE) 10-320 MG per tablet Take 1  tablet by mouth daily.    Historical Provider, MD  Carbonyl Iron (FEOSOL PO) Take 1 tablet by mouth daily.    Historical Provider, MD  cyclobenzaprine (FLEXERIL) 10 MG tablet Take 10 mg by mouth at bedtime as needed. For muscle pain.    Historical Provider, MD  esomeprazole (NEXIUM) 40 MG capsule Take 40 mg by mouth daily as needed.     Historical Provider, MD  norgestrel-ethinyl estradiol (LO/OVRAL,CRYSELLE) 0.3-30 MG-MCG tablet Take 1 tablet by mouth daily.    Historical Provider, MD  tinidazole (TINDAMAX) 500 MG tablet Take 500 mg by mouth daily with breakfast.    Historical Provider, MD  tranexamic acid (LYSTEDA) 650 MG TABS Take 1,300 mg by mouth 3 (three) times daily.    Historical Provider, MD   LMP 08/15/2011 Physical Exam  Constitutional: She is oriented to person, place, and time. She appears well-developed and well-nourished.  Non-toxic appearance. No distress.  HENT:  Head: Normocephalic and atraumatic.  Eyes: Conjunctivae, EOM and lids are normal. Pupils are equal, round, and reactive to light.  Pale conjunctiva noted  Neck: Normal range of motion. Neck supple. No tracheal deviation present. No thyroid mass present.  Cardiovascular: Normal rate, regular rhythm and normal heart sounds.  Exam reveals no gallop.   No murmur heard. Pulmonary/Chest: Effort normal and breath sounds normal. No  stridor. No respiratory distress. She has no decreased breath sounds. She has no wheezes. She has no rhonchi. She has no rales.  Abdominal: Soft. Normal appearance and bowel sounds are normal. She exhibits no distension. There is no tenderness. There is no rebound and no CVA tenderness.  Musculoskeletal: Normal range of motion. She exhibits no edema or tenderness.  Neurological: She is alert and oriented to person, place, and time. She has normal strength. No cranial nerve deficit or sensory deficit. GCS eye subscore is 4. GCS verbal subscore is 5. GCS motor subscore is 6.  Skin: Skin is warm and  dry. No abrasion and no rash noted.  Psychiatric: She has a normal mood and affect. Her speech is normal and behavior is normal.  Nursing note and vitals reviewed.   ED Course  Procedures (including critical care time) Labs Review Labs Reviewed  CBC WITH DIFFERENTIAL/PLATELET  BASIC METABOLIC PANEL  TYPE AND SCREEN    Imaging Review No results found.   EKG Interpretation None      MDM   Final diagnoses:  None    Pt transfused with prbcs and will be admitted to medicine    Toy Baker, MD 04/03/14 1233

## 2014-04-03 NOTE — Progress Notes (Signed)
Pt BP 212/94.  Pt states "that is low for me".  MD paged, orders received. Beth Pierce

## 2014-04-04 ENCOUNTER — Other Ambulatory Visit: Payer: Self-pay | Admitting: Cardiology

## 2014-04-04 DIAGNOSIS — N921 Excessive and frequent menstruation with irregular cycle: Secondary | ICD-10-CM

## 2014-04-04 DIAGNOSIS — D649 Anemia, unspecified: Secondary | ICD-10-CM

## 2014-04-04 DIAGNOSIS — I472 Ventricular tachycardia: Secondary | ICD-10-CM

## 2014-04-04 DIAGNOSIS — I471 Supraventricular tachycardia: Secondary | ICD-10-CM

## 2014-04-04 DIAGNOSIS — E65 Localized adiposity: Secondary | ICD-10-CM

## 2014-04-04 DIAGNOSIS — I4729 Other ventricular tachycardia: Secondary | ICD-10-CM

## 2014-04-04 LAB — COMPREHENSIVE METABOLIC PANEL
ALK PHOS: 136 U/L — AB (ref 39–117)
ALT: 15 U/L (ref 0–35)
AST: 19 U/L (ref 0–37)
Albumin: 3.3 g/dL — ABNORMAL LOW (ref 3.5–5.2)
Anion gap: 8 (ref 5–15)
BUN: 12 mg/dL (ref 6–23)
CALCIUM: 9.1 mg/dL (ref 8.4–10.5)
CHLORIDE: 104 mmol/L (ref 96–112)
CO2: 27 mmol/L (ref 19–32)
Creatinine, Ser: 0.66 mg/dL (ref 0.50–1.10)
GFR calc Af Amer: >90 mL/min (ref >90)
GLUCOSE: 121 mg/dL — AB (ref 70–99)
Potassium: 3.7 mmol/L (ref 3.5–5.1)
Sodium: 139 mmol/L (ref 135–145)
TOTAL PROTEIN: 7.6 g/dL (ref 6.0–8.3)
Total Bilirubin: 0.4 mg/dL (ref 0.3–1.2)

## 2014-04-04 LAB — TYPE AND SCREEN
ABO/RH(D): O POS
ANTIBODY SCREEN: NEGATIVE
UNIT DIVISION: 0
UNIT DIVISION: 0

## 2014-04-04 LAB — CBC
HCT: 29.4 % — ABNORMAL LOW (ref 36.0–46.0)
HCT: 32.6 % — ABNORMAL LOW (ref 36.0–46.0)
Hemoglobin: 8.5 g/dL — ABNORMAL LOW (ref 12.0–15.0)
Hemoglobin: 9.7 g/dL — ABNORMAL LOW (ref 12.0–15.0)
MCH: 20.7 pg — AB (ref 26.0–34.0)
MCH: 21.1 pg — ABNORMAL LOW (ref 26.0–34.0)
MCHC: 28.9 g/dL — ABNORMAL LOW (ref 30.0–36.0)
MCHC: 29.8 g/dL — ABNORMAL LOW (ref 30.0–36.0)
MCV: 71.7 fL — AB (ref 78.0–100.0)
MCV: 71 fL — AB (ref 78.0–100.0)
PLATELETS: 227 10*3/uL (ref 150–400)
Platelets: ADEQUATE 10*3/uL (ref 150–400)
RBC: 4.1 MIL/uL (ref 3.87–5.11)
RBC: 4.59 MIL/uL (ref 3.87–5.11)
RDW: 20.4 % — ABNORMAL HIGH (ref 11.5–15.5)
RDW: 20.4 % — ABNORMAL HIGH (ref 11.5–15.5)
WBC: 8.7 10*3/uL (ref 4.0–10.5)
WBC: 7.7 10*3/uL (ref 4.0–10.5)

## 2014-04-04 LAB — TROPONIN I: Troponin I: <0.03 ng/mL (ref <0.031)

## 2014-04-04 LAB — MAGNESIUM: Magnesium: 2 mg/dL (ref 1.5–2.5)

## 2014-04-04 MED ORDER — MEGESTROL ACETATE 40 MG PO TABS: 40.0000 mg | ORAL_TABLET | Freq: Three times a day (TID) | ORAL | Status: DC

## 2014-04-04 MED ORDER — MEGESTROL ACETATE 40 MG PO TABS
40.0000 mg | ORAL_TABLET | Freq: Three times a day (TID) | ORAL | Status: DC
  Administered 2014-04-04 (×2): 40 mg via ORAL
  Filled 2014-04-04 (×3): qty 1

## 2014-04-04 MED ORDER — CARVEDILOL 3.125 MG PO TABS
3.1250 mg | ORAL_TABLET | Freq: Two times a day (BID) | ORAL | Status: DC
  Administered 2014-04-04: 3.125 mg via ORAL
  Filled 2014-04-04: qty 1

## 2014-04-04 MED ORDER — CLONIDINE HCL 0.1 MG PO TABS
0.1000 mg | ORAL_TABLET | Freq: Three times a day (TID) | ORAL | Status: DC | PRN
  Filled 2014-04-04: qty 1

## 2014-04-04 MED ORDER — CARVEDILOL 3.125 MG PO TABS: 3.1250 mg | ORAL_TABLET | Freq: Two times a day (BID) | ORAL | Status: DC

## 2014-04-04 MED ORDER — POTASSIUM CHLORIDE CRYS ER 20 MEQ PO TBCR
40.0000 meq | EXTENDED_RELEASE_TABLET | Freq: Once | ORAL | Status: AC
  Administered 2014-04-04: 40 meq via ORAL
  Filled 2014-04-04: qty 2

## 2014-04-04 MED ORDER — BUDESONIDE-FORMOTEROL FUMARATE 160-4.5 MCG/ACT IN AERO: 2.0000 | INHALATION_SPRAY | Freq: Two times a day (BID) | RESPIRATORY_TRACT | Status: AC

## 2014-04-04 NOTE — Discharge Summary (Addendum)
Physician Discharge Summary  Beth Pierce ZOX:[id number] DOB: 11-Sep-1960 DOA: 04/03/2014  PCP: Johny BlamerHARRIS, WILLIAM, MD  Admit date: 04/03/2014 Discharge date: 04/04/2014  Time spent: Less than 30 minutes  Recommendations for Outpatient Follow-up:  1. Dr. Willodean Rosenthalarolyn Harraway-Smith, GYN on 04/05/14 at 2:30 PM. To be seen with repeat labs (CBC). 2. Dr. Johny BlamerWilliam Pierce, PCP in 3 days. Will need follow-up of CBC. 3. Dr. Armanda Magicraci Turner, Cardiology: MDs office will call to arrange for outpatient heart monitor and M.D. follow-up.  Discharge Diagnoses:  Active Problems:   Anemia   Discharge Condition: Improved & Stable  Diet recommendation: Heart healthy diet.  Filed Weights   04/03/14 1300  Weight: 154.677 kg (341 lb)    History of present illness & Hospital course:  54 year old female patient with history of essential hypertension, chronic anemia (baseline hemoglobin? 7 g per DL), GERD, ongoing menorrhagia, was sent from PCPs office for evaluation and management of hemoglobin 6.3 g per DL. She complained of intermittent dizziness and dyspnea on exertion but denied chest pain or palpitations. She denies history of uterine fibroids and has been having ongoing heavy menstrual bleeding since she returned age 54 years. No screening colonoscopy yet. She denies having seen a gynecologist. She was admitted to the hospital and was transfused 2 units of PRBCs and her hemoglobin improved to 9.7 g per DL. Her dizziness and DOE resolved. Discussed with GYN M.D. on call who advised to start her on Megace 40 mg 3 times a day and follow-up in the office on 04/05/14 for possible endometrial biopsy and repeat CBCs. Transabdominal and transvaginal ultrasound was done and results are as below-were reviewed with GYN M.D. Patient was noted to have an incidental, asymptomatic 10 beat NSVT on telemetry on 04/03/14 at 7:43 PM. This was confirmed with a cardiologist who advised follow-up 2-D echo and if normal, outpatient heart  monitor & start low-dose carvedilol. Low-dose carvedilol should also help her poorly controlled hypertension. TSH normal. Potassium was replaced. Magnesium normal. Echo results as below. Contacted cardiology service prior to DC, who will arrange for heart monitor and cardiologist follow-up in a.m.   Consultations:  2 D Echo 04/04/2014: Study Conclusions  - Left ventricle: False tendon in LV apex of no clinical significance. Systolic function was normal. The estimated ejection fraction was in the range of 55% to 60%. Wall motion was normal; there were no regional wall motion abnormalities. There was an increased relative contribution of atrial contraction to ventricular filling. Doppler parameters are consistent with abnormal left ventricular relaxation (grade 1 diastolic dysfunction). - Mitral valve: Mild focal thickening of the anterior leaflet. - Left atrium: The atrium was mildly to moderately dilated. - Atrial septum: No defect or patent foramen ovale was identified.  Procedures:  None    Discharge Exam:  Complaints:  No further dizziness or DOE. Continues to have vaginal bleeding. No chest pain, palpitations.  Filed Vitals:   04/04/14 0305 04/04/14 0557 04/04/14 0916 04/04/14 1700  BP: 157/87 130/99  135/84  Pulse: 86 91  92  Temp:  98 F (36.7 C)    TempSrc:  Oral    Resp:  20    Height:      Weight:      SpO2:  100% 99%     General exam: Moderately built and morbidly obese female patient sitting up comfortably in chair. Respiratory system: Clear. No increased work of breathing. Cardiovascular system: S1 & S2 heard, RRR. No JVD, murmurs, gallops, clicks or pedal edema. Telemetry: Sinus  rhythm. A 10 beat NSVT noticed on 04/03/14 at 7:43 PM. Gastrointestinal system: Abdomen is nondistended, soft and nontender. Normal bowel sounds heard. Central nervous system: Alert and oriented. No focal neurological deficits. Extremities: Symmetric 5 x 5  power.  Discharge Instructions      Discharge Instructions    Call MD for:  difficulty breathing, headache or visual disturbances    Complete by:  As directed      Call MD for:  extreme fatigue    Complete by:  As directed      Call MD for:  persistant dizziness or light-headedness    Complete by:  As directed      Call MD for:  severe uncontrolled pain    Complete by:  As directed      Diet - low sodium heart healthy    Complete by:  As directed      Increase activity slowly    Complete by:  As directed             Medication List    STOP taking these medications        iron polysaccharides 150 MG capsule  Commonly known as:  NIFEREX      TAKE these medications        albuterol 108 (90 BASE) MCG/ACT inhaler  Commonly known as:  PROVENTIL HFA;VENTOLIN HFA  Inhale 2 puffs into the lungs every 6 (six) hours as needed for wheezing.     amLODipine-valsartan 10-320 MG per tablet  Commonly known as:  EXFORGE  Take 1 tablet by mouth daily.     budesonide-formoterol 160-4.5 MCG/ACT inhaler  Commonly known as:  SYMBICORT  Inhale 2 puffs into the lungs 2 (two) times daily.     carvedilol 3.125 MG tablet  Commonly known as:  COREG  Take 1 tablet (3.125 mg total) by mouth 2 (two) times daily with a meal.     esomeprazole 40 MG capsule  Commonly known as:  NEXIUM  Take 40 mg by mouth daily.     ferrous sulfate 325 (65 FE) MG tablet  Take 325 mg by mouth 2 (two) times daily with a meal.     hydrochlorothiazide 25 MG tablet  Commonly known as:  HYDRODIURIL  Take 25 mg by mouth daily.     megestrol 40 MG tablet  Commonly known as:  MEGACE  Take 1 tablet (40 mg total) by mouth 3 (three) times daily.     Naproxen Sod-Diphenhydramine 220-25 MG Tabs  Take 1 tablet by mouth at bedtime as needed (for pain/sleep).     traZODone 50 MG tablet  Commonly known as:  DESYREL  Take 50 mg by mouth at bedtime as needed for sleep.       Follow-up Information    Follow up with  Willodean Rosenthal, MD On 04/05/2014.   Specialty:  Obstetrics and Gynecology   Why:  Appointment is at 2:30 PM. Arrive at 2:15 PM to complete paper work. To be seen with repeat labs (CBC).   Contact information:   [address] [place] [address] [phone]       Follow up with Johny Blamer, MD. Schedule an appointment as soon as possible for a visit in 3 days.   Specialty:  Family Medicine   Why:  Will need to follow up labs (CBC).   Contact information:   [address] [place] [address] [phone]       Follow up with Quintella Reichert,  MD.   Specialty:  Cardiology   Why:  MDs office will call to arrange for outpatient heart monitor and follow-up.   Contact information:   [address] [place] [address] [phone]        The results of significant diagnostics from this hospitalization (including imaging, microbiology, ancillary and laboratory) are listed below for reference.    Significant Diagnostic Studies: US Transvaginal Non-ob  05-02-2014   CLINICAL DATA:  Menorrhagia  EXAM: TRANSABDOMINAL AND TRANSVAGINAL ULTRASOUND OF PELVIS  TECHNIQUE: Study was performed transabdominally to optimize pelvic field of view evaluation and transvaginally to optimize internal visceral architecture evaluation.  COMPARISON:  May 10, 2012  FINDINGS: Uterus  Measurements: 7.1 x 5.2 x 6.2 cm. The uterus appears diffusely inhomogeneous without well-defined dominant mass.  Endometrium  Thickness: 9 mm.  No focal abnormality visualized.  Contour smooth.  Right ovary  Unable to visualize by transabdominal or transvaginal examination. No right-sided extrauterine pelvic mass.  Left ovary  Unable to visualize by transabdominal or transvaginal examination. No left-sided extrauterine pelvic mass.  Other findings  No free fluid.  IMPRESSION: The uterus appears diffusely inhomogeneous in echotexture without dominant mass. There may be diffuse leiomyomatous  change without well-defined dominant mass given this appearance. Endometrium mildly thickened for age. Endometrial thickness is considered abnormal for an asymptomatic post-menopausal female. Endometrial sampling should be considered to exclude carcinoma, particularly given the clinical history. Neither ovary could be visualized by transabdominal or transvaginal technique. This finding could be due to atrophy secondary to postmenopausal state. No extrauterine pelvic mass identified. No free pelvic fluid appreciable.   Electronically Signed   By: Bretta Bang M.D.   On: 05-02-2014 19:39   US Pelvis Complete  02-May-2014   CLINICAL DATA:  Menorrhagia  EXAM: TRANSABDOMINAL AND TRANSVAGINAL ULTRASOUND OF PELVIS  TECHNIQUE: Study was performed transabdominally to optimize pelvic field of view evaluation and transvaginally to optimize internal visceral architecture evaluation.  COMPARISON:  May 10, 2012  FINDINGS: Uterus  Measurements: 7.1 x 5.2 x 6.2 cm. The uterus appears diffusely inhomogeneous without well-defined dominant mass.  Endometrium  Thickness: 9 mm.  No focal abnormality visualized.  Contour smooth.  Right ovary  Unable to visualize by transabdominal or transvaginal examination. No right-sided extrauterine pelvic mass.  Left ovary  Unable to visualize by transabdominal or transvaginal examination. No left-sided extrauterine pelvic mass.  Other findings  No free fluid.  IMPRESSION: The uterus appears diffusely inhomogeneous in echotexture without dominant mass. There may be diffuse leiomyomatous change without well-defined dominant mass given this appearance. Endometrium mildly thickened for age. Endometrial thickness is considered abnormal for an asymptomatic post-menopausal female. Endometrial sampling should be considered to exclude carcinoma, particularly given the clinical history. Neither ovary could be visualized by transabdominal or transvaginal technique. This finding could be due to atrophy  secondary to postmenopausal state. No extrauterine pelvic mass identified. No free pelvic fluid appreciable.   Electronically Signed   By: Bretta Bang M.D.   On: 05-02-2014 19:39    Microbiology: No results found for this or any previous visit (from the past 240 hour(s)).   Labs: Basic Metabolic Panel:  Recent Labs Lab 05-02-2014 1053 05/02/14 1530 04/04/14 0220  NA 140  --  139  K 3.9  --  3.7  CL 106  --  104  CO2 28  --  27  GLUCOSE 122*  --  121*  BUN 12  --  12  CREATININE 0.71  --  0.66  CALCIUM 8.8  --  9.1  MG  --  2.1 2.0   Liver Function Tests:  Recent Labs Lab 04/03/14 1530 04/04/14 0220  AST 18 19  ALT 17 15  ALKPHOS 143* 136*  BILITOT 0.4 0.4  PROT 7.9 7.6  ALBUMIN 3.3* 3.3*   No results for input(s): LIPASE, AMYLASE in the last 168 hours. No results for input(s): AMMONIA in the last 168 hours. CBC:  Recent Labs Lab 04/03/14 1053 04/03/14 1530 04/04/14 0220 04/04/14 1205  WBC 5.5 6.1 8.7 7.7  NEUTROABS 3.3  --   --   --   HGB 6.3* 6.5* 8.5* 9.7*  HCT 23.2* 24.1* 29.4* 32.6*  MCV 70.1* 70.3* 71.7* 71.0*  PLT 229 249 PLATELET CLUMPS NOTED ON SMEAR, COUNT APPEARS ADEQUATE 227   Cardiac Enzymes:  Recent Labs Lab 04/03/14 1530 04/03/14 1905 04/04/14 0220  TROPONINI <0.03 <0.03 <0.03   BNP: BNP (last 3 results) No results for input(s): PROBNP in the last 8760 hours. CBG: No results for input(s): GLUCAP in the last 168 hours.      Signed:  Marcellus Scott, MD, FACP, FHM. [facility] Pager [phone]  If 7PM-7AM, please contact night-coverage [email or web address] Password [facility] 04/04/2014, 5:51 PM

## 2014-04-04 NOTE — Progress Notes (Signed)
Report given and care assumed of this Pt. No changes noted at this time of Pt's assessment at this time

## 2014-04-04 NOTE — Progress Notes (Signed)
UR completed 

## 2014-04-04 NOTE — Discharge Instructions (Signed)
Anemia, Nonspecific Anemia is a condition in which the concentration of red blood cells or hemoglobin in the blood is below normal. Hemoglobin is a substance in red blood cells that carries oxygen to the tissues of the body. Anemia results in not enough oxygen reaching these tissues.  CAUSES  Common causes of anemia include:   Excessive bleeding. Bleeding may be internal or external. This includes excessive bleeding from periods (in women) or from the intestine.   Poor nutrition.   Chronic kidney, thyroid, and liver disease.  Bone marrow disorders that decrease red blood cell production.  Cancer and treatments for cancer.  HIV, AIDS, and their treatments.  Spleen problems that increase red blood cell destruction.  Blood disorders.  Excess destruction of red blood cells due to infection, medicines, and autoimmune disorders. SIGNS AND SYMPTOMS   Minor weakness.   Dizziness.   Headache.  Palpitations.   Shortness of breath, especially with exercise.   Paleness.  Cold sensitivity.  Indigestion.  Nausea.  Difficulty sleeping.  Difficulty concentrating. Symptoms may occur suddenly or they may develop slowly.  DIAGNOSIS  Additional blood tests are often needed. These help your health care provider determine the best treatment. Your health care provider will check your stool for blood and look for other causes of blood loss.  TREATMENT  Treatment varies depending on the cause of the anemia. Treatment can include:   Supplements of iron, vitamin B12, or folic acid.   Hormone medicines.   A blood transfusion. This may be needed if blood loss is severe.   Hospitalization. This may be needed if there is significant continual blood loss.   Dietary changes.  Spleen removal. HOME CARE INSTRUCTIONS Keep all follow-up appointments. It often takes many weeks to correct anemia, and having your health care provider check on your condition and your response to  treatment is very important. SEEK IMMEDIATE MEDICAL CARE IF:   You develop extreme weakness, shortness of breath, or chest pain.   You become dizzy or have trouble concentrating.  You develop heavy vaginal bleeding.   You develop a rash.   You have bloody or black, tarry stools.   You faint.   You vomit up blood.   You vomit repeatedly.   You have abdominal pain.  You have a fever or persistent symptoms for more than 2-3 days.   You have a fever and your symptoms suddenly get worse.   You are dehydrated.  MAKE SURE YOU:  Understand these instructions.  Will watch your condition.  Will get help right away if you are not doing well or get worse. Document Released: 04/02/2004 Document Revised: 10/26/2012 Document Reviewed: 08/19/2012 [facility] Patient Information 2015 [place], [place]. This information is not intended to replace advice given to you by your health care provider. Make sure you discuss any questions you have with your health care provider.  Menorrhagia Menorrhagia is the medical term for when your menstrual periods are heavy or last longer than usual. With menorrhagia, every period you have may cause enough blood loss and cramping that you are unable to maintain your usual activities. CAUSES  In some cases, the cause of heavy periods is unknown, but a number of conditions may cause menorrhagia. Common causes include:  A problem with the hormone-producing thyroid gland (hypothyroid).  Noncancerous growths in the uterus (polyps or fibroids).  An imbalance of the estrogen and progesterone hormones.  One of your ovaries not releasing an egg during one or more months.  Side effects of  having an intrauterine device (IUD).  Side effects of some medicines, such as anti-inflammatory medicines or blood thinners.  A bleeding disorder that stops your blood from clotting normally. SIGNS AND SYMPTOMS  During a normal period, bleeding lasts between 4 and 8  days. Signs that your periods are too heavy include:  You routinely have to change your pad or tampon every 1 or 2 hours because it is completely soaked.  You pass blood clots larger than 1 inch (2.5 cm) in size.  You have bleeding for more than 7 days.  You need to use pads and tampons at the same time because of heavy bleeding.  You need to wake up to change your pads or tampons during the night.  You have symptoms of anemia, such as tiredness, fatigue, or shortness of breath. DIAGNOSIS  Your health care provider will perform a physical exam and ask you questions about your symptoms and menstrual history. Other tests may be ordered based on what the health care provider finds during the exam. These tests can include:  Blood tests. Blood tests are used to check if you are pregnant or have hormonal changes, a bleeding or thyroid disorder, low iron levels (anemia), or other problems.  Endometrial biopsy. Your health care provider takes a sample of tissue from the inside of your uterus to be examined under a microscope.  Pelvic ultrasound. This test uses sound waves to make a picture of your uterus, ovaries, and vagina. The pictures can show if you have fibroids or other growths.  Hysteroscopy. For this test, your health care provider will use a small telescope to look inside your uterus. Based on the results of your initial tests, your health care provider may recommend further testing. TREATMENT  Treatment may not be needed. If it is needed, your health care provider may recommend treatment with one or more medicines first. If these do not reduce bleeding enough, a surgical treatment might be an option. The best treatment for you will depend on:   Whether you need to prevent pregnancy.  Your desire to have children in the future.  The cause and severity of your bleeding.  Your opinion and personal preference.  Medicines for menorrhagia may include:  Birth control methods that  use hormones. These include the pill, skin patch, vaginal ring, shots that you get every 3 months, hormonal IUD, and implant. These treatments reduce bleeding during your menstrual period.  Medicines that thicken blood and slow bleeding.  Medicines that reduce swelling, such as ibuprofen.  Medicines that contain a synthetic hormone called progestin.   Medicines that make the ovaries stop working for a short time.  You may need surgical treatment for menorrhagia if the medicines are unsuccessful. Treatment options include:  Dilation and curettage (D&C). In this procedure, your health care provider opens (dilates) your cervix and then scrapes or suctions tissue from the lining of your uterus to reduce menstrual bleeding.  Operative hysteroscopy. This procedure uses a tiny tube with a light (hysteroscope) to view your uterine cavity and can help in the surgical removal of a polyp that may be causing heavy periods.  Endometrial ablation. Through various techniques, your health care provider permanently destroys the entire lining of your uterus (endometrium). After endometrial ablation, most women have little or no menstrual flow. Endometrial ablation reduces your ability to become pregnant.  Endometrial resection. This surgical procedure uses an electrosurgical wire loop to remove the lining of the uterus. This procedure also reduces your ability to become  pregnant.  Hysterectomy. Surgical removal of the uterus and cervix is a permanent procedure that stops menstrual periods. Pregnancy is not possible after a hysterectomy. This procedure requires anesthesia and hospitalization. HOME CARE INSTRUCTIONS   Only take over-the-counter or prescription medicines as directed by your health care provider. Take prescribed medicines exactly as directed. Do not change or switch medicines without consulting your health care provider.  Take any prescribed iron pills exactly as directed by your health care  provider. Long-term heavy bleeding may result in low iron levels. Iron pills help replace the iron your body lost from heavy bleeding. Iron may cause constipation. If this becomes a problem, increase the bran, fruits, and roughage in your diet.  Do not take aspirin or medicines that contain aspirin 1 week before or during your menstrual period. Aspirin may make the bleeding worse.  If you need to change your sanitary pad or tampon more than once every 2 hours, stay in bed and rest as much as possible until the bleeding stops.  Eat well-balanced meals. Eat foods high in iron. Examples are leafy green vegetables, meat, liver, eggs, and whole grain breads and cereals. Do not try to lose weight until the abnormal bleeding has stopped and your blood iron level is back to normal. SEEK MEDICAL CARE IF:   You soak through a pad or tampon every 1 or 2 hours, and this happens every time you have a period.  You need to use pads and tampons at the same time because you are bleeding so much.  You need to change your pad or tampon during the night.  You have a period that lasts for more than 8 days.  You pass clots bigger than 1 inch wide.  You have irregular periods that happen more or less often than once a month.  You feel dizzy or faint.  You feel very weak or tired.  You feel short of breath or feel your heart is beating too fast when you exercise.  You have nausea and vomiting or diarrhea while you are taking your medicine.  You have any problems that may be related to the medicine you are taking. SEEK IMMEDIATE MEDICAL CARE IF:   You soak through 4 or more pads or tampons in 2 hours.  You have any bleeding while you are pregnant. MAKE SURE YOU:   Understand these instructions.  Will watch your condition.  Will get help right away if you are not doing well or get worse. Document Released: 02/23/2005 Document Revised: 02/28/2013 Document Reviewed: 08/14/2012 [facility] Patient  Information 2015 [place], [place]. This information is not intended to replace advice given to you by your health care provider. Make sure you discuss any questions you have with your health care provider.

## 2014-04-04 NOTE — Progress Notes (Signed)
Echocardiogram 2D Echocardiogram has been performed.  Dorothey BasemanReel, Jayston Trevino M 04/04/2014, 3:21 PM

## 2014-04-04 NOTE — Progress Notes (Signed)
IV team came to start a new IV on pt. Pt allowed IV team to attempt once, it was unsuccessful. Pt stated she did not want to have any IV fluids and will drink for fluids. NP on call made aware. Will continue to monitor

## 2014-04-05 ENCOUNTER — Ambulatory Visit: Payer: PRIVATE HEALTH INSURANCE | Admitting: Obstetrics & Gynecology

## 2014-04-05 ENCOUNTER — Encounter: Payer: Self-pay | Admitting: *Deleted

## 2014-04-05 ENCOUNTER — Telehealth: Payer: Self-pay | Admitting: *Deleted

## 2014-04-05 NOTE — Telephone Encounter (Signed)
Attempted to contact patient, number is incorrect.  Will send letter.  Letter sent.

## 2014-04-06 ENCOUNTER — Telehealth: Payer: Self-pay | Admitting: Obstetrics & Gynecology

## 2014-04-06 NOTE — Telephone Encounter (Signed)
Patient called this morning to reschedule her appointment. She was here on yesterday, and when she tried to sign in she was too late so she just left. Patient asked what was this visit for, and when I told her she stated she wanted to talk to her provider first. She will call us later if she still needs the appointment.

## 2014-04-11 ENCOUNTER — Telehealth: Payer: Self-pay | Admitting: Cardiology

## 2014-04-11 NOTE — Telephone Encounter (Signed)
Received order for Monitor.  I have am unable to reach the patient to schedule the monitor.  Phone just ring and no answer service.   Will defer x 1 month.

## 2014-08-14 ENCOUNTER — Other Ambulatory Visit (HOSPITAL_COMMUNITY)
Admission: RE | Admit: 2014-08-14 | Discharge: 2014-08-14 | Disposition: A | Discharge status: Home / Self Care | Payer: PRIVATE HEALTH INSURANCE | Source: Ambulatory Visit | Attending: Obstetrics and Gynecology | Admitting: Obstetrics and Gynecology

## 2014-08-14 ENCOUNTER — Other Ambulatory Visit: Payer: Self-pay | Admitting: Nurse Practitioner

## 2014-08-14 DIAGNOSIS — Z01419 Encounter for gynecological examination (general) (routine) without abnormal findings: Secondary | ICD-10-CM | POA: Diagnosis present

## 2014-08-16 ENCOUNTER — Other Ambulatory Visit (HOSPITAL_COMMUNITY): Payer: Self-pay | Admitting: Nurse Practitioner

## 2014-08-16 DIAGNOSIS — N921 Excessive and frequent menstruation with irregular cycle: Secondary | ICD-10-CM

## 2014-08-17 LAB — CYTOLOGY - PAP

## 2014-08-21 ENCOUNTER — Ambulatory Visit (HOSPITAL_COMMUNITY): Payer: PRIVATE HEALTH INSURANCE | Attending: Nurse Practitioner

## 2015-02-18 IMAGING — US US PELVIS COMPLETE
1 series · 13 of 25 positions shown · non-contrast
Comparison: 01/30/2009

CLINICAL DATA: Abnormal uterine bleeding.  Pelvic pain.  Anemia.
LMP 04/09/2012.



[Series 1: us pelvis complete · 13 of 43 slices shown]
[im 1/43]
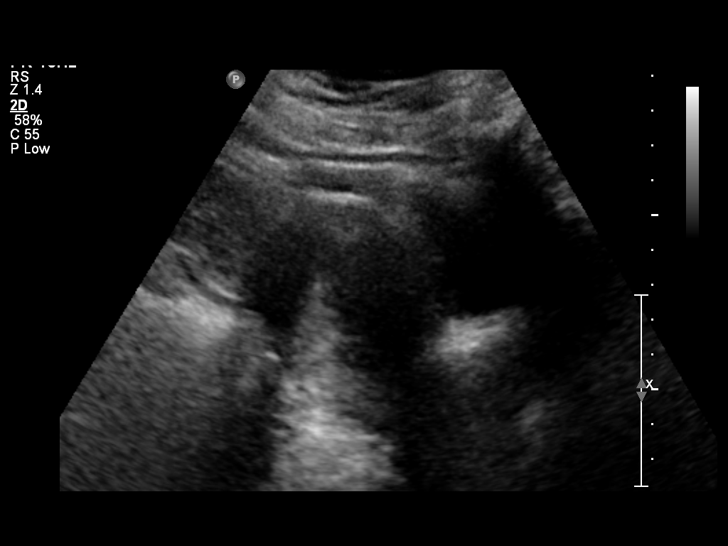
[im 4/43]
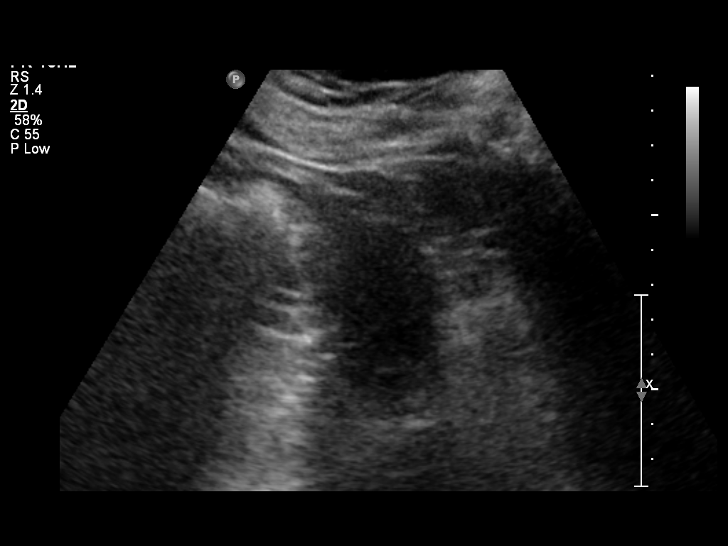
[im 8/43]
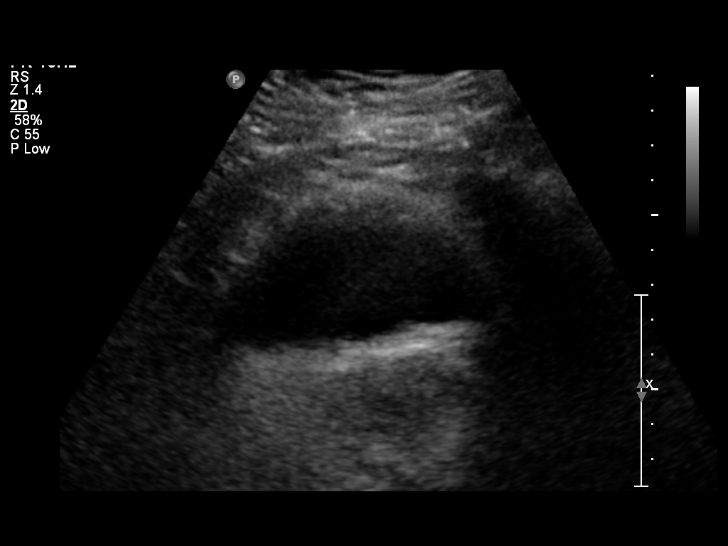
[im 11/43]
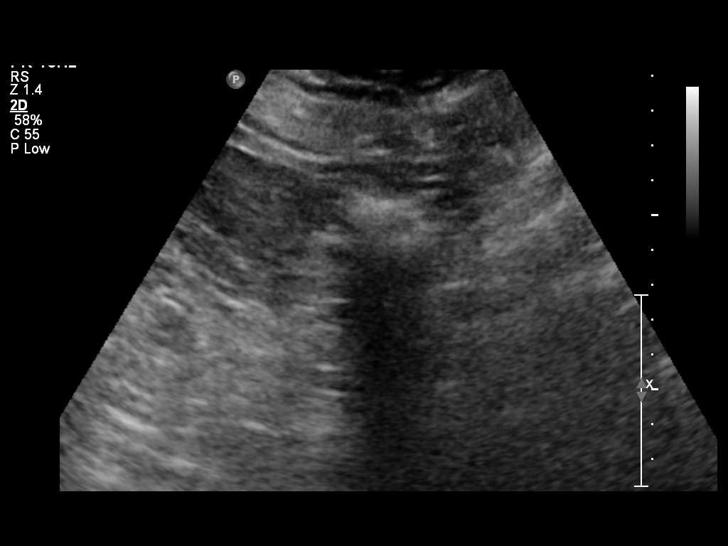
[im 15/43]
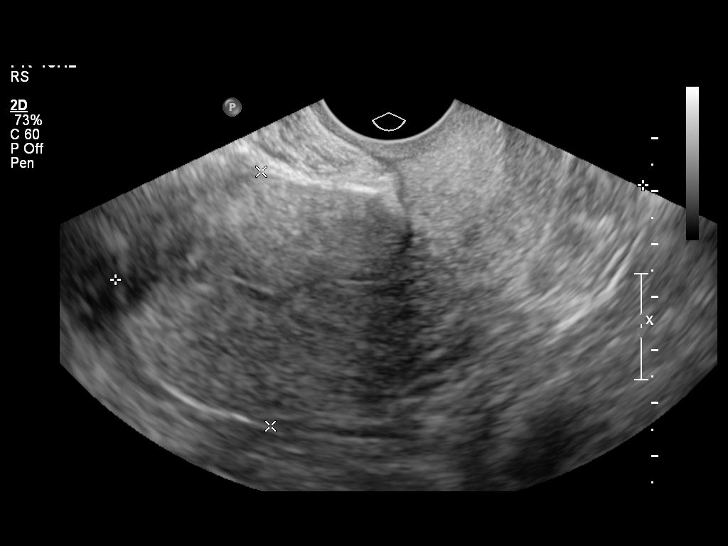
[im 18/43]
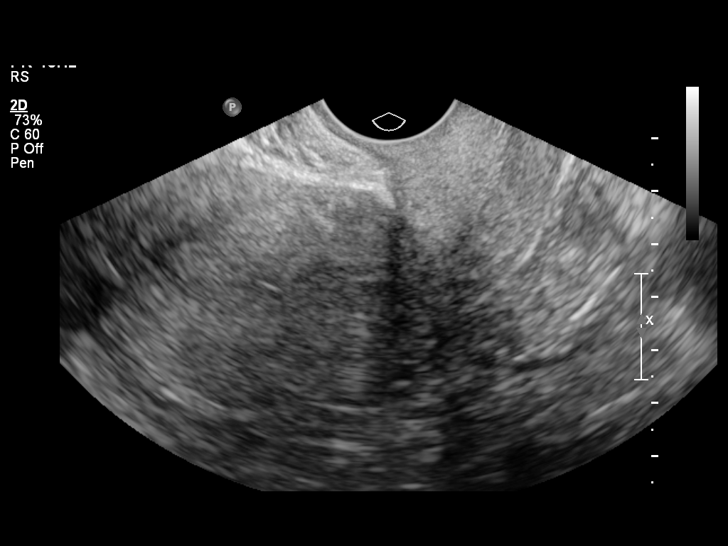
[im 22/43]
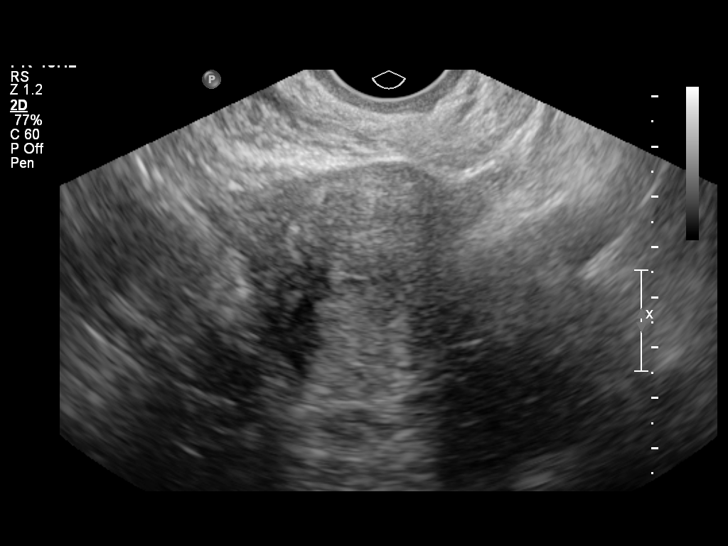
[im 25/43]
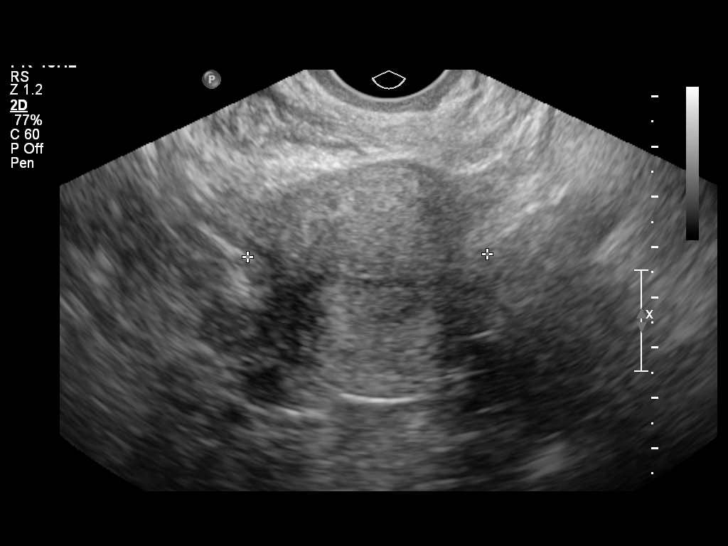
[im 29/43]
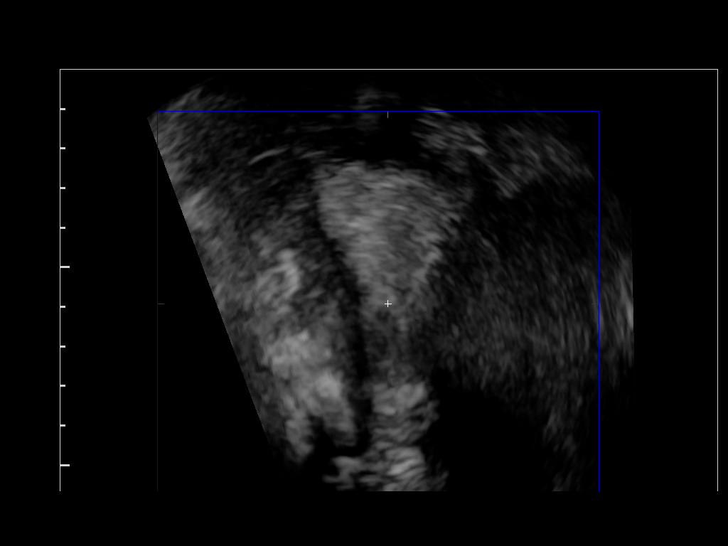
[im 32/43]
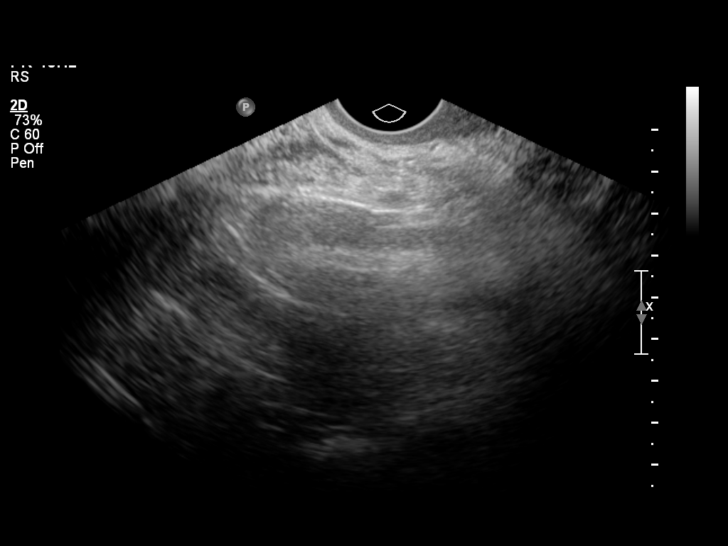
[im 36/43]
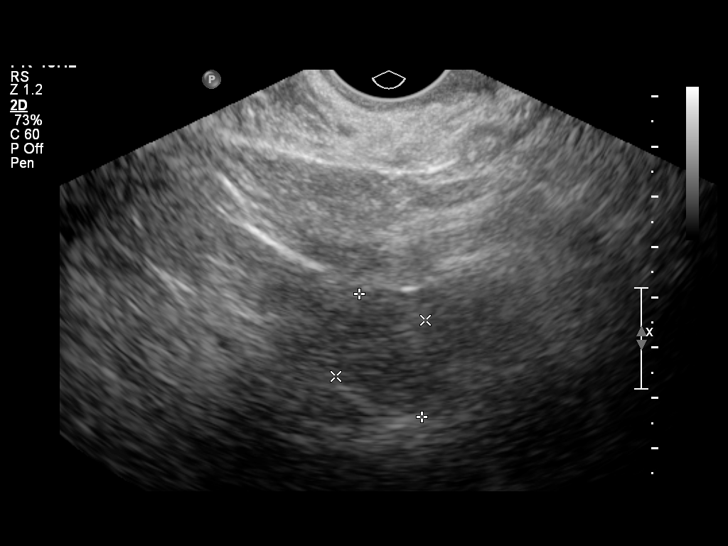
[im 39/43]
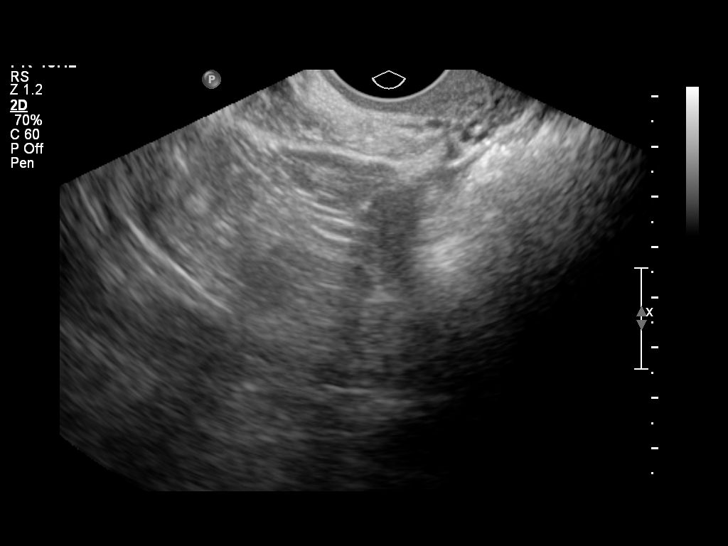
[im 43/43]
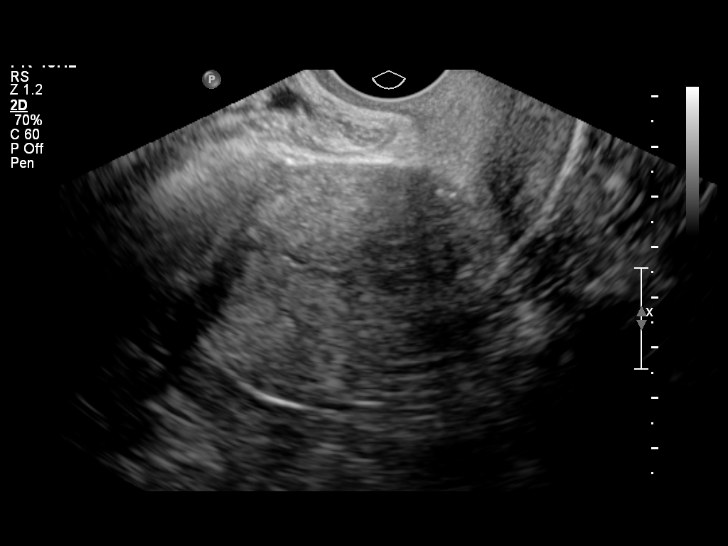

[13 of 25 positions shown; findings below may reference images not displayed]

FINDINGS: Exam technically difficult due to large patient habitus.

Uterus:  10.1 x 4.8 x 4.8 cm.  Heterogeneous echogenicity of
uterine myometrium noted, but no distinct fibroids identified.

Endometrium: Double layer thickness measures 4 mm transvaginally.
No focal lesion visualized.

Right ovary: not directly visualized by transabdominal or
transvaginal sonography, however no adnexal mass identified.

Left ovary: Visualized only by transabdominal sonography measuring
2.8 x 2.1 x 2.5 cm.  Normal appearance.  No adnexal mass
identified.

Other Findings:  No free fluid
IMPRESSION: 1.  No definite fibroids identified.  Endometrial thickness
measures 4 mm. If bleeding remains unresponsive to hormonal or
medical therapy, sonohysterogram should be considered for focal
lesion work-up. (Ref:  Radiological Reasoning: Algorithmic Workup
of Abnormal Vaginal Bleeding with Endovaginal Sonography and
Sonohysterography. AJR [id number]; [id number]:[id number])
2.  No adnexal mass or other significant abnormality identified.

## 2015-03-29 ENCOUNTER — Other Ambulatory Visit (HOSPITAL_COMMUNITY): Payer: Self-pay | Admitting: Nurse Practitioner

## 2015-03-29 ENCOUNTER — Ambulatory Visit (HOSPITAL_COMMUNITY): Admission: RE | Admit: 2015-03-29 | Payer: PRIVATE HEALTH INSURANCE | Source: Ambulatory Visit

## 2015-03-29 DIAGNOSIS — N921 Excessive and frequent menstruation with irregular cycle: Secondary | ICD-10-CM

## 2017-01-11 IMAGING — US US PELVIS COMPLETE
1 series · 13 of 25 positions shown · non-contrast
Comparison: May 10, 2012

CLINICAL DATA: Menorrhagia

EXAM:
TRANSABDOMINAL AND TRANSVAGINAL ULTRASOUND OF PELVIS
TECHNIQUE: Study was performed transabdominally to optimize pelvic field of
view evaluation and transvaginally to optimize internal visceral
architecture evaluation.

[Series 1: us pelvis complete · 0.27mm/px · 13 of 42 slices shown]
[im 1/42]
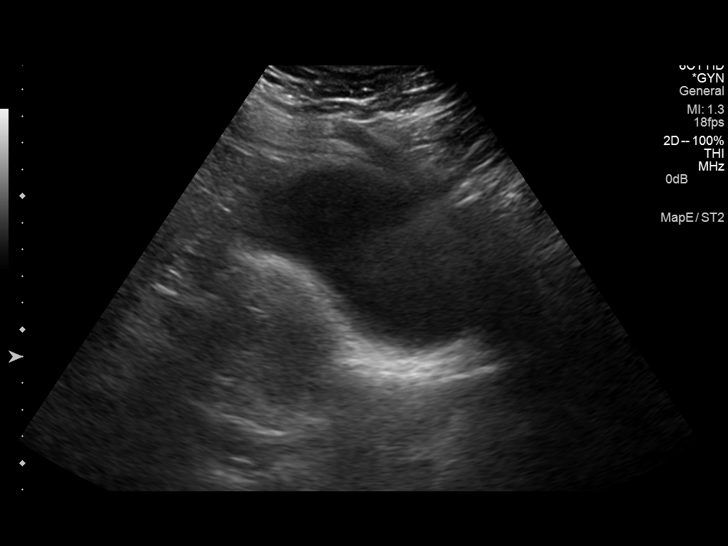
[im 4/42]
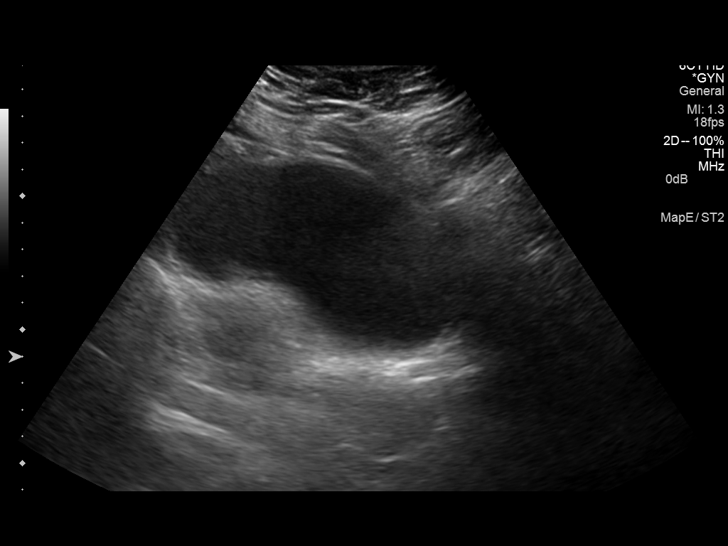
[im 7/42]
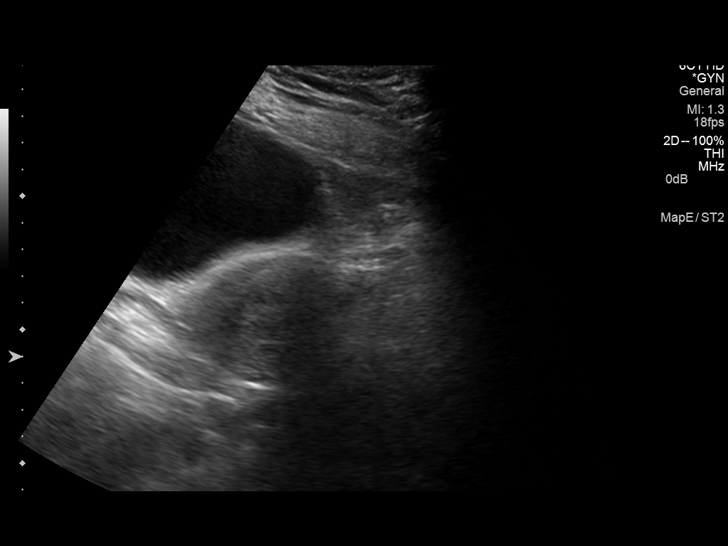
[im 11/42]
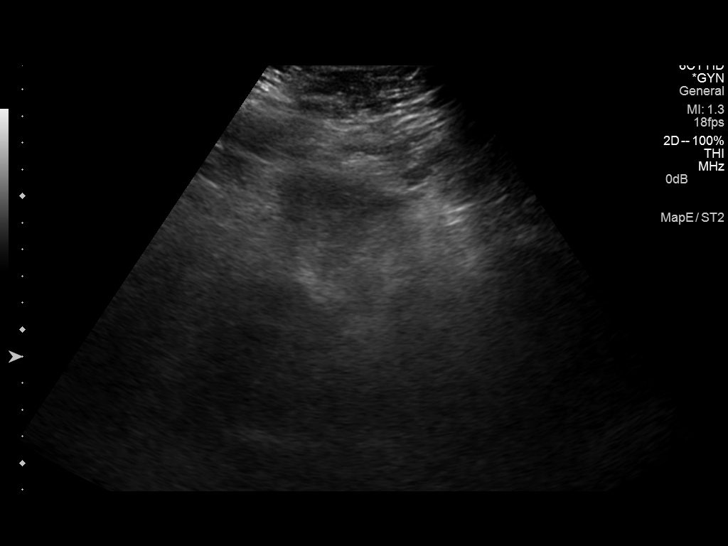
[im 14/42]
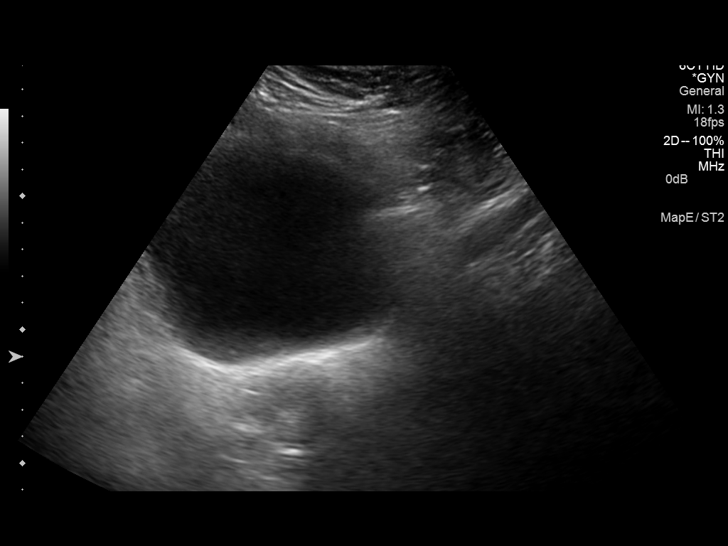
[im 18/42]
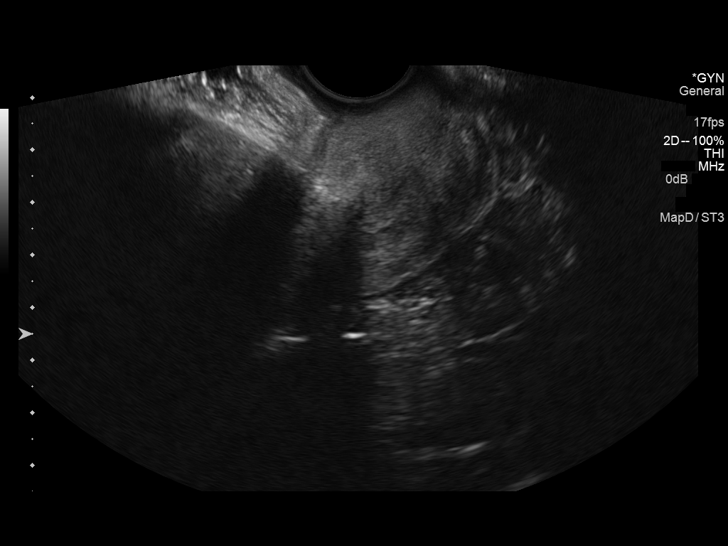
[im 21/42]
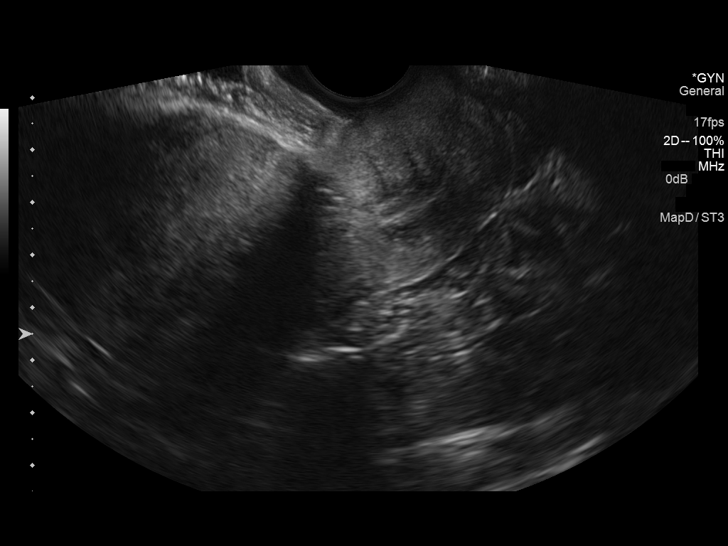
[im 24/42]
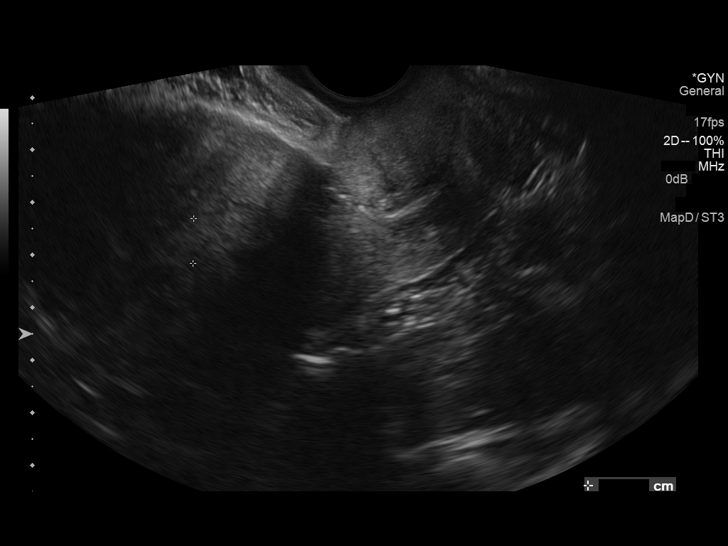
[im 28/42]
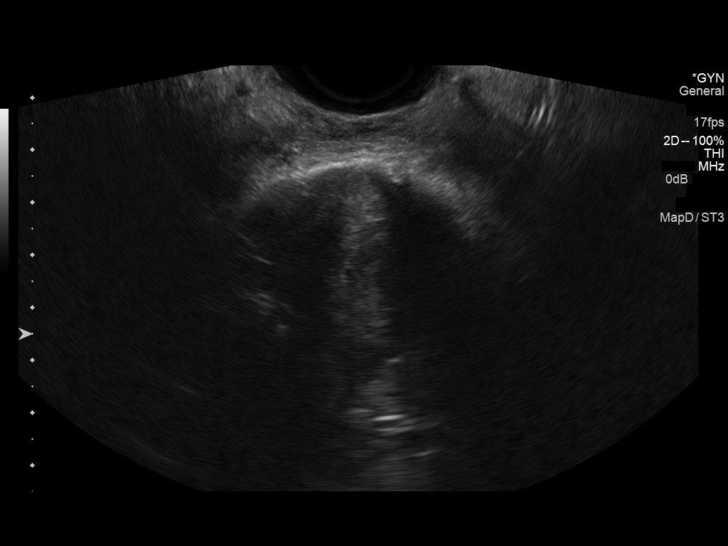
[im 31/42]
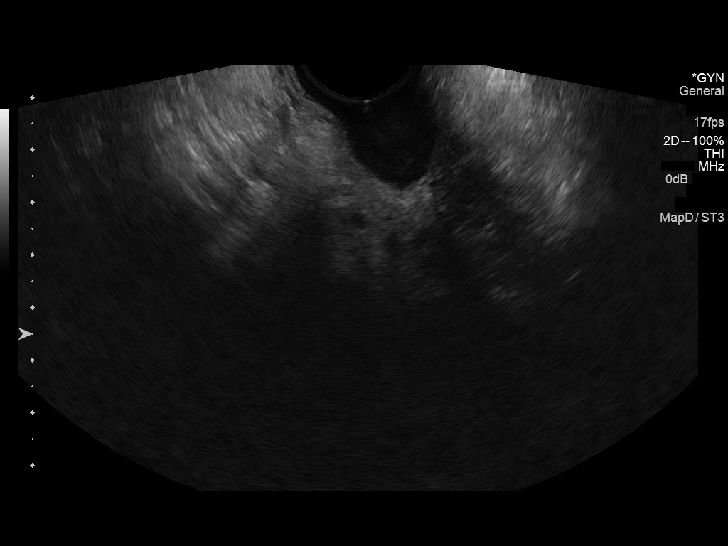
[im 35/42]
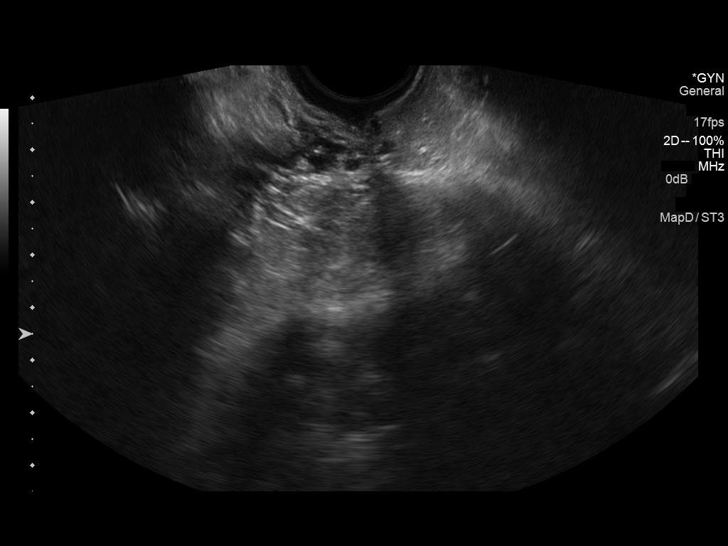
[im 38/42]
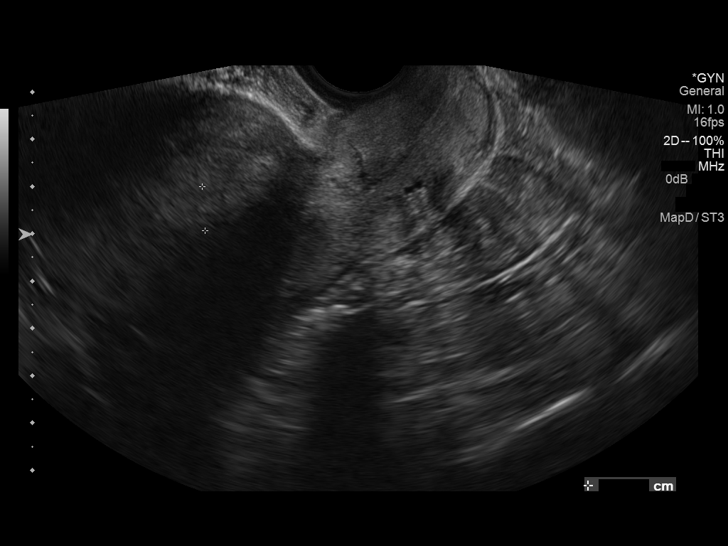
[im 42/42]
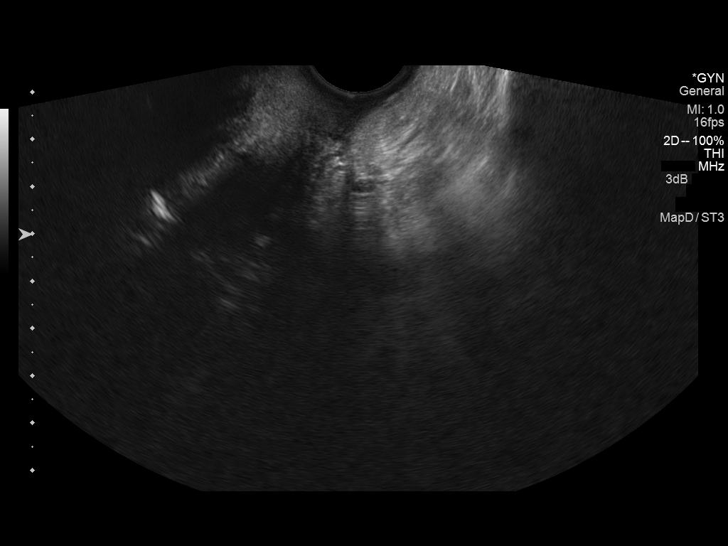

[13 of 25 positions shown; findings below may reference images not displayed]

FINDINGS: Uterus

Measurements: 7.1 x 5.2 x 6.2 cm. The uterus appears diffusely
inhomogeneous without well-defined dominant mass.

Endometrium

Thickness: 9 mm.  No focal abnormality visualized.  Contour smooth.

Right ovary

Unable to visualize by transabdominal or transvaginal examination.
No right-sided extrauterine pelvic mass.

Left ovary

Unable to visualize by transabdominal or transvaginal examination.
No left-sided extrauterine pelvic mass.

Other findings

No free fluid.
IMPRESSION: The uterus appears diffusely inhomogeneous in echotexture without
dominant mass. There may be diffuse leiomyomatous change without
well-defined dominant mass given this appearance. Endometrium mildly
thickened for age. Endometrial thickness is considered abnormal for
an asymptomatic post-menopausal female. Endometrial sampling should
be considered to exclude carcinoma, particularly given the clinical
history. Neither ovary could be visualized by transabdominal or
transvaginal technique. This finding could be due to atrophy
secondary to postmenopausal state. No extrauterine pelvic mass
identified. No free pelvic fluid appreciable.

## 2017-08-13 ENCOUNTER — Encounter (HOSPITAL_COMMUNITY): Payer: PRIVATE HEALTH INSURANCE

## 2018-02-11 ENCOUNTER — Ambulatory Visit (HOSPITAL_COMMUNITY)
Admission: RE | Admit: 2018-02-11 | Discharge: 2018-02-11 | Disposition: A | Discharge status: Home / Self Care | Payer: PRIVATE HEALTH INSURANCE | Source: Ambulatory Visit | Attending: Family Medicine | Admitting: Family Medicine

## 2018-02-11 DIAGNOSIS — D509 Iron deficiency anemia, unspecified: Secondary | ICD-10-CM | POA: Insufficient documentation

## 2018-02-11 MED ORDER — SODIUM CHLORIDE 0.9 % IV SOLN
510.0000 mg | Freq: Once | INTRAVENOUS | Status: AC
  Administered 2018-02-11: 510 mg via INTRAVENOUS
  Filled 2018-02-11: qty 17

## 2018-02-11 MED ORDER — SODIUM CHLORIDE 0.9 % IV SOLN
INTRAVENOUS | Status: DC | PRN
  Administered 2018-02-11: 250 mL via INTRAVENOUS

## 2018-02-11 NOTE — Progress Notes (Signed)
PATIENT CARE CENTER NOTE  Diagnosis: Iron Deficiency Anemia    Provider: Dr. Johny BlamerWilliam Harris   Procedure: IV Feraheme   Note: Patient received Feraheme infusion. Monitored patient for 30 minutes post-infusion. Blood pressure elevated. Per patient she did not take blood pressure medications this morning. Patient advised to take medications. Discharge instructions given. Patient alert, oriented and ambulatory at discharge.

## 2018-02-11 NOTE — Discharge Instructions (Signed)

## 2018-02-18 ENCOUNTER — Encounter (HOSPITAL_COMMUNITY): Payer: PRIVATE HEALTH INSURANCE

## 2019-06-05 ENCOUNTER — Other Ambulatory Visit (HOSPITAL_COMMUNITY): Payer: Self-pay | Admitting: *Deleted

## 2019-06-06 ENCOUNTER — Encounter (HOSPITAL_COMMUNITY)
Admission: RE | Admit: 2019-06-06 | Discharge: 2019-06-06 | Disposition: A | Discharge status: Home / Self Care | Payer: Medicaid Other | Source: Ambulatory Visit | Attending: Family Medicine | Admitting: Family Medicine

## 2019-06-06 ENCOUNTER — Other Ambulatory Visit: Payer: Self-pay

## 2019-06-06 DIAGNOSIS — D509 Iron deficiency anemia, unspecified: Secondary | ICD-10-CM | POA: Diagnosis present

## 2019-06-06 MED ORDER — SODIUM CHLORIDE 0.9 % IV SOLN
510.0000 mg | INTRAVENOUS | Status: DC
  Administered 2019-06-06: 510 mg via INTRAVENOUS
  Filled 2019-06-06: qty 17

## 2019-06-13 ENCOUNTER — Encounter (HOSPITAL_COMMUNITY)
Admission: RE | Admit: 2019-06-13 | Discharge: 2019-06-13 | Disposition: A | Discharge status: Home / Self Care | Payer: Medicaid Other | Source: Ambulatory Visit | Attending: Family Medicine | Admitting: Family Medicine

## 2019-06-13 ENCOUNTER — Other Ambulatory Visit: Payer: Self-pay

## 2019-06-13 DIAGNOSIS — D509 Iron deficiency anemia, unspecified: Secondary | ICD-10-CM | POA: Diagnosis not present

## 2019-06-13 MED ORDER — SODIUM CHLORIDE 0.9 % IV SOLN
510.0000 mg | INTRAVENOUS | Status: AC
  Administered 2019-06-13: 510 mg via INTRAVENOUS
  Filled 2019-06-13: qty 17

## 2019-07-05 ENCOUNTER — Other Ambulatory Visit: Payer: Self-pay | Admitting: Nurse Practitioner

## 2019-07-10 ENCOUNTER — Other Ambulatory Visit: Payer: Self-pay | Admitting: Nurse Practitioner

## 2019-07-10 DIAGNOSIS — N938 Other specified abnormal uterine and vaginal bleeding: Secondary | ICD-10-CM

## 2019-07-18 ENCOUNTER — Other Ambulatory Visit: Payer: Medicaid Other

## 2020-04-23 DIAGNOSIS — M1711 Unilateral primary osteoarthritis, right knee: Secondary | ICD-10-CM | POA: Diagnosis not present

## 2020-04-23 DIAGNOSIS — M25561 Pain in right knee: Secondary | ICD-10-CM | POA: Diagnosis not present

## 2020-12-24 DIAGNOSIS — M170 Bilateral primary osteoarthritis of knee: Secondary | ICD-10-CM | POA: Diagnosis not present

## 2020-12-24 DIAGNOSIS — E119 Type 2 diabetes mellitus without complications: Secondary | ICD-10-CM | POA: Diagnosis not present

## 2020-12-24 DIAGNOSIS — I10 Essential (primary) hypertension: Secondary | ICD-10-CM | POA: Diagnosis not present

## 2020-12-24 DIAGNOSIS — D500 Iron deficiency anemia secondary to blood loss (chronic): Secondary | ICD-10-CM | POA: Diagnosis not present

## 2020-12-24 DIAGNOSIS — Z8709 Personal history of other diseases of the respiratory system: Secondary | ICD-10-CM | POA: Diagnosis not present

## 2020-12-24 DIAGNOSIS — K219 Gastro-esophageal reflux disease without esophagitis: Secondary | ICD-10-CM | POA: Diagnosis not present

## 2021-06-17 DIAGNOSIS — J45909 Unspecified asthma, uncomplicated: Secondary | ICD-10-CM | POA: Diagnosis not present

## 2021-06-17 DIAGNOSIS — I10 Essential (primary) hypertension: Secondary | ICD-10-CM | POA: Diagnosis not present

## 2021-06-17 DIAGNOSIS — J069 Acute upper respiratory infection, unspecified: Secondary | ICD-10-CM | POA: Diagnosis not present

## 2021-07-17 DIAGNOSIS — I10 Essential (primary) hypertension: Secondary | ICD-10-CM | POA: Diagnosis not present

## 2021-07-17 DIAGNOSIS — E119 Type 2 diabetes mellitus without complications: Secondary | ICD-10-CM | POA: Diagnosis not present

## 2021-07-17 DIAGNOSIS — F5101 Primary insomnia: Secondary | ICD-10-CM | POA: Diagnosis not present

## 2021-07-17 DIAGNOSIS — R053 Chronic cough: Secondary | ICD-10-CM | POA: Diagnosis not present

## 2021-07-17 DIAGNOSIS — D509 Iron deficiency anemia, unspecified: Secondary | ICD-10-CM | POA: Diagnosis not present

## 2021-07-18 ENCOUNTER — Emergency Department (HOSPITAL_COMMUNITY)
Admission: EM | Admit: 2021-07-18 | Discharge: 2021-07-18 | Disposition: A | Discharge status: Home / Self Care | Payer: Medicare Other | Attending: Emergency Medicine | Admitting: Emergency Medicine

## 2021-07-18 ENCOUNTER — Encounter (HOSPITAL_COMMUNITY): Payer: Self-pay

## 2021-07-18 ENCOUNTER — Other Ambulatory Visit: Payer: Self-pay

## 2021-07-18 DIAGNOSIS — D649 Anemia, unspecified: Secondary | ICD-10-CM | POA: Insufficient documentation

## 2021-07-18 DIAGNOSIS — R799 Abnormal finding of blood chemistry, unspecified: Secondary | ICD-10-CM | POA: Diagnosis present

## 2021-07-18 LAB — POC OCCULT BLOOD, ED
Fecal Occult Bld: NEGATIVE
Fecal Occult Bld: NEGATIVE

## 2021-07-18 LAB — IRON AND TIBC
Iron: 15 ug/dL — ABNORMAL LOW (ref 28–170)
Saturation Ratios: 4 % — ABNORMAL LOW (ref 10.4–31.8)
TIBC: 381 ug/dL (ref 250–450)
UIBC: 366 ug/dL

## 2021-07-18 LAB — ABO/RH: ABO/RH(D): O POS

## 2021-07-18 LAB — CBC
HCT: 25.3 % — ABNORMAL LOW (ref 36.0–46.0)
Hemoglobin: 6.5 g/dL — CL (ref 12.0–15.0)
MCH: 17.3 pg — ABNORMAL LOW (ref 26.0–34.0)
MCHC: 25.7 g/dL — ABNORMAL LOW (ref 30.0–36.0)
MCV: 67.5 fL — ABNORMAL LOW (ref 80.0–100.0)
Platelets: 256 10*3/uL (ref 150–400)
RBC: 3.75 MIL/uL — ABNORMAL LOW (ref 3.87–5.11)
RDW: 22.5 % — ABNORMAL HIGH (ref 11.5–15.5)
WBC: 4.2 10*3/uL (ref 4.0–10.5)
nRBC: 0 % (ref 0.0–0.2)

## 2021-07-18 LAB — PREPARE RBC (CROSSMATCH)

## 2021-07-18 MED ORDER — SODIUM CHLORIDE 0.9 % IV SOLN: 10.0000 mL/h | Freq: Once | INTRAVENOUS | Status: DC

## 2021-07-18 NOTE — ED Provider Notes (Signed)
?[facility]-EMERGENCY DEPT ?Provider Note ? ? ?CSN: [id number] ?Arrival date & time: 07/18/21  8938 ? ?  ? ?History ? ?Chief Complaint  ?Patient presents with  ?? Abnormal Lab  ? ? ?Beth Pierce is a 61 y.o. female. ? ?HPI ?61 year old female presents today stating that she had a low blood count and was told to come to the hospital for transfusion.  She has had anemia and required transfusions in the past.  He states that previously she had associated vaginal bleeding.  However she had a gynecologic procedure several years ago and has not had any ongoing bleeding.  She reports that she does not have any blood in her stool.  She has not noted any dark stool.  She does not think that she has had a prior colonoscopy.  She has been having some shortness of breath.  She reports that she has been taking her iron as prescribed.  She does not appear to be on any medications that would cause bleeding.  She denies any chest pain or lightheadedness.  The dyspnea has been ongoing but has gotten a little bit worse recently with exertion. ?  ? ?Home Medications ?Prior to Admission medications   ?Medication Sig Start Date End Date Taking? Authorizing Provider  ?albuterol (PROVENTIL HFA;VENTOLIN HFA) 108 (90 BASE) MCG/ACT inhaler Inhale 2 puffs into the lungs every 6 (six) hours as needed for wheezing.   Yes [provider]  ?amLODipine-valsartan (EXFORGE) 10-320 MG per tablet Take 1 tablet by mouth daily.   Yes [provider]  ?budesonide-formoterol (SYMBICORT) 160-4.5 MCG/ACT inhaler Inhale 2 puffs into the lungs 2 (two) times daily. ?Patient taking differently: Inhale 2 puffs into the lungs 2 (two) times daily as needed (shortness of breath). 04/04/14  Yes Hongalgi, Maximino Greenland, MD  ?carvedilol (COREG) 6.25 MG tablet Take 6.25 mg by mouth 2 (two) times daily. 06/17/21  Yes [provider]  ?diphenhydrAMINE (BENADRYL) 25 MG tablet Take 25 mg by mouth every 6 (six) hours as needed for  allergies.   Yes [provider]  ?esomeprazole (NEXIUM) 40 MG capsule Take 40 mg by mouth daily.    Yes [provider]  ?ferrous sulfate 325 (65 FE) MG tablet Take 325 mg by mouth 2 (two) times daily with a meal.   Yes [provider]  ?hydrochlorothiazide (HYDRODIURIL) 25 MG tablet Take 25 mg by mouth daily.   Yes [provider]  ?traZODone (DESYREL) 50 MG tablet Take 50 mg by mouth at bedtime as needed for sleep.    Yes [provider]  ?   ? ?Allergies    ?Eggs or egg-derived products and Lactose intolerance (gi)   ? ?Review of Systems   ?Review of Systems  ?All other systems reviewed and are negative. ? ?Physical Exam ?Updated Vital Signs ?BP (!) 180/83 Comment: Simultaneous filing. User may not have seen previous data.  Pulse 73   Temp 98 ?F (36.7 ?C) (Oral)   Resp 14 Comment: Simultaneous filing. User may not have seen previous data.  LMP 08/15/2011   SpO2 100%  ?Physical Exam ?Vitals and nursing note reviewed.  ?Constitutional:   ?   Appearance: Normal appearance.  ?HENT:  ?   Head: Normocephalic.  ?   Right Ear: External ear normal.  ?   Left Ear: External ear normal.  ?   Nose: Nose normal.  ?   Mouth/Throat:  ?   Mouth: Mucous membranes are moist.  ?   Pharynx: Oropharynx  is clear.  ?Eyes:  ?   Extraocular Movements: Extraocular movements intact.  ?   Pupils: Pupils are equal, round, and reactive to light.  ?   Comments: Conjunctiva pale  ?Cardiovascular:  ?   Rate and Rhythm: Normal rate and regular rhythm.  ?   Pulses: Normal pulses.  ?Pulmonary:  ?   Effort: Pulmonary effort is normal.  ?Abdominal:  ?   General: Bowel sounds are normal.  ?   Palpations: Abdomen is soft.  ?Genitourinary: ?   Rectum: Normal.  ?Musculoskeletal:     ?   General: Normal range of motion.  ?   Cervical back: Normal range of motion.  ?Skin: ?   General: Skin is warm and dry.  ?   Capillary Refill: Capillary refill takes less than 2 seconds.  ?Neurological:  ?   General: No  focal deficit present.  ?   Mental Status: She is alert.  ?Psychiatric:     ?   Mood and Affect: Mood normal.     ?   Behavior: Behavior normal.  ? ? ?ED Results / Procedures / Treatments   ?Labs ?(all labs ordered are listed, but only abnormal results are displayed) ?Labs Reviewed  ?CBC - Abnormal; Notable for the following components:  ?    Result Value  ? RBC 3.75 (*)   ? Hemoglobin 6.5 (*)   ? HCT 25.3 (*)   ? MCV 67.5 (*)   ? MCH 17.3 (*)   ? MCHC 25.7 (*)   ? RDW 22.5 (*)   ? All other components within normal limits  ?IRON AND TIBC - Abnormal; Notable for the following components:  ? Iron 15 (*)   ? Saturation Ratios 4 (*)   ? All other components within normal limits  ?POC OCCULT BLOOD, ED  ?POC OCCULT BLOOD, ED  ?PREPARE RBC (CROSSMATCH)  ?TYPE AND SCREEN  ?ABO/RH  ? ? ?EKG ?None ? ?Radiology ?No results found. ? ?Procedures ?Procedures  ? ? ?Medications Ordered in ED ?Medications  ?0.9 %  sodium chloride infusion (has no administration in time range)  ? ? ?ED Course/ Medical Decision Making/ A&P ?Clinical Course as of 07/18/21 1700  ?Fri Jul 18, 2021  ?1033 POC occult blood, ED Provider will collect ?Hemoccult personally reviewed and no red blood and minimal stool noted Hemoccult testing reviewed and negative [DR]  ?1127 CBC(!!) ?Patient with hemoglobin of 6.5 consistent with her known history of anemia [DR]  ?1310 Very stable.  Sitting in bed, talking on phone, smiling and interactive ?Awaiting packed red blood cell infusion [DR]  ?1404 Blood infusing at this time [DR]  ?1700 Patient has remained stable here. ? [DR]  ?  ?Clinical Course User Index ?[DR] Margarita Grizzleay, Amante Fomby, MD  ? ?                        ?Medical Decision Making ?Patient with history of iron deficiency anemia presents with reports of low hemoglobin. ?Differential diagnosis includes active GI bleeding, other etiologies of red blood cell suppression and sequela I of severe anemia, including cardiac ischemia, hypotension, and dyspnea.  Patient  does have some sequela with the reported dyspnea but here is hemodynamically stable with normal heart rate, blood pressure, oxygen saturations and is not significantly dyspneic.  CBC is obtained and reviewed and she is anemic with a hemoglobin of 6.5 other cell lines are within normal limits given the low index of suspicion for hematologic etiology of  saline suppression. ?Patient had Hemoccult performed which is negative. ?She will be transfused 2 units here in the ED and should be stable for discharge ? ?Amount and/or Complexity of Data Reviewed ?Labs: ordered. Decision-making details documented in ED Course. ? ?Risk ?Prescription drug management. ? ? ? ? ? ? ? ? ?Final Clinical Impression(s) / ED Diagnoses ?Final diagnoses:  ?Anemia, unspecified type  ? ? ?Rx / DC Orders ?ED Discharge Orders   ? ? None  ? ?  ? ? ?  ?Margarita Grizzle, MD ?07/18/21 1648 ? ?

## 2021-07-18 NOTE — ED Triage Notes (Signed)
Pt reports having labs drawn yesterday and was told her hemoglobin was 6.6. Pt endorses shob and weakness. Denies any obvious blood loss.  ?

## 2021-07-18 NOTE — ED Notes (Signed)
Blood bank has blood ready for this patient. Notified nurse, Sherrilyn Rist. ?

## 2021-07-18 NOTE — Discharge Instructions (Signed)
Please continue your home iron ?Have a recheck with your doctor next week ?Return if you are having any worsening symptoms especially chest pain or shortness of breath ?

## 2021-07-18 NOTE — ED Notes (Signed)
Per Dr. Jeanell Sparrow second unit of blood to be administered over 1 hour.  ?

## 2021-07-19 LAB — TYPE AND SCREEN
ABO/RH(D): O POS
Antibody Screen: NEGATIVE
Unit division: 0
Unit division: 0

## 2021-07-19 LAB — BPAM RBC
Blood Product Expiration Date: 202306122359
Blood Product Expiration Date: 202306122359
ISSUE DATE / TIME: 202305121313
ISSUE DATE / TIME: 202305121632
Unit Type and Rh: 5100
Unit Type and Rh: 5100

## 2021-12-05 ENCOUNTER — Other Ambulatory Visit: Payer: Self-pay | Admitting: Family Medicine

## 2021-12-05 DIAGNOSIS — I739 Peripheral vascular disease, unspecified: Secondary | ICD-10-CM

## 2022-01-22 DIAGNOSIS — I739 Peripheral vascular disease, unspecified: Secondary | ICD-10-CM | POA: Diagnosis not present

## 2022-01-22 DIAGNOSIS — F5101 Primary insomnia: Secondary | ICD-10-CM | POA: Diagnosis not present

## 2022-01-22 DIAGNOSIS — D509 Iron deficiency anemia, unspecified: Secondary | ICD-10-CM | POA: Diagnosis not present

## 2022-01-22 DIAGNOSIS — K219 Gastro-esophageal reflux disease without esophagitis: Secondary | ICD-10-CM | POA: Diagnosis not present

## 2022-01-22 DIAGNOSIS — M170 Bilateral primary osteoarthritis of knee: Secondary | ICD-10-CM | POA: Diagnosis not present

## 2022-01-22 DIAGNOSIS — I10 Essential (primary) hypertension: Secondary | ICD-10-CM | POA: Diagnosis not present

## 2022-01-22 DIAGNOSIS — E119 Type 2 diabetes mellitus without complications: Secondary | ICD-10-CM | POA: Diagnosis not present

## 2022-07-29 ENCOUNTER — Other Ambulatory Visit: Payer: Self-pay | Admitting: Family Medicine

## 2022-07-29 DIAGNOSIS — I739 Peripheral vascular disease, unspecified: Secondary | ICD-10-CM

## 2022-07-29 DIAGNOSIS — R600 Localized edema: Secondary | ICD-10-CM | POA: Diagnosis not present

## 2022-07-29 DIAGNOSIS — D500 Iron deficiency anemia secondary to blood loss (chronic): Secondary | ICD-10-CM | POA: Diagnosis not present

## 2022-07-29 DIAGNOSIS — R0602 Shortness of breath: Secondary | ICD-10-CM | POA: Diagnosis not present

## 2022-07-29 DIAGNOSIS — E119 Type 2 diabetes mellitus without complications: Secondary | ICD-10-CM | POA: Diagnosis not present

## 2022-07-29 DIAGNOSIS — E1151 Type 2 diabetes mellitus with diabetic peripheral angiopathy without gangrene: Secondary | ICD-10-CM | POA: Diagnosis not present

## 2022-07-29 DIAGNOSIS — F5101 Primary insomnia: Secondary | ICD-10-CM | POA: Diagnosis not present

## 2022-07-29 DIAGNOSIS — I10 Essential (primary) hypertension: Secondary | ICD-10-CM | POA: Diagnosis not present

## 2022-07-29 DIAGNOSIS — K219 Gastro-esophageal reflux disease without esophagitis: Secondary | ICD-10-CM | POA: Diagnosis not present

## 2023-02-02 DIAGNOSIS — E119 Type 2 diabetes mellitus without complications: Secondary | ICD-10-CM | POA: Diagnosis not present

## 2023-02-02 DIAGNOSIS — F5101 Primary insomnia: Secondary | ICD-10-CM | POA: Diagnosis not present

## 2023-02-02 DIAGNOSIS — D500 Iron deficiency anemia secondary to blood loss (chronic): Secondary | ICD-10-CM | POA: Diagnosis not present

## 2023-02-02 DIAGNOSIS — I10 Essential (primary) hypertension: Secondary | ICD-10-CM | POA: Diagnosis not present

## 2023-02-02 DIAGNOSIS — K219 Gastro-esophageal reflux disease without esophagitis: Secondary | ICD-10-CM | POA: Diagnosis not present

## 2023-02-02 DIAGNOSIS — M170 Bilateral primary osteoarthritis of knee: Secondary | ICD-10-CM | POA: Diagnosis not present

## 2023-08-11 ENCOUNTER — Emergency Department (HOSPITAL_COMMUNITY)
Admission: EM | Admit: 2023-08-11 | Discharge: 2023-08-11 | Disposition: A | Discharge status: Home / Self Care | Attending: Emergency Medicine | Admitting: Emergency Medicine

## 2023-08-11 ENCOUNTER — Encounter (HOSPITAL_COMMUNITY): Payer: Self-pay

## 2023-08-11 DIAGNOSIS — N939 Abnormal uterine and vaginal bleeding, unspecified: Secondary | ICD-10-CM | POA: Diagnosis not present

## 2023-08-11 DIAGNOSIS — I739 Peripheral vascular disease, unspecified: Secondary | ICD-10-CM | POA: Insufficient documentation

## 2023-08-11 DIAGNOSIS — D62 Acute posthemorrhagic anemia: Secondary | ICD-10-CM | POA: Insufficient documentation

## 2023-08-11 DIAGNOSIS — R799 Abnormal finding of blood chemistry, unspecified: Secondary | ICD-10-CM | POA: Diagnosis present

## 2023-08-11 DIAGNOSIS — Z79899 Other long term (current) drug therapy: Secondary | ICD-10-CM | POA: Diagnosis not present

## 2023-08-11 DIAGNOSIS — R0602 Shortness of breath: Secondary | ICD-10-CM | POA: Diagnosis not present

## 2023-08-11 DIAGNOSIS — D649 Anemia, unspecified: Secondary | ICD-10-CM | POA: Diagnosis not present

## 2023-08-11 DIAGNOSIS — I10 Essential (primary) hypertension: Secondary | ICD-10-CM | POA: Insufficient documentation

## 2023-08-11 DIAGNOSIS — M1990 Unspecified osteoarthritis, unspecified site: Secondary | ICD-10-CM | POA: Insufficient documentation

## 2023-08-11 DIAGNOSIS — Z6841 Body Mass Index (BMI) 40.0 and over, adult: Secondary | ICD-10-CM | POA: Insufficient documentation

## 2023-08-11 DIAGNOSIS — E1165 Type 2 diabetes mellitus with hyperglycemia: Secondary | ICD-10-CM | POA: Insufficient documentation

## 2023-08-11 DIAGNOSIS — G43009 Migraine without aura, not intractable, without status migrainosus: Secondary | ICD-10-CM | POA: Insufficient documentation

## 2023-08-11 DIAGNOSIS — J45909 Unspecified asthma, uncomplicated: Secondary | ICD-10-CM | POA: Insufficient documentation

## 2023-08-11 DIAGNOSIS — F5101 Primary insomnia: Secondary | ICD-10-CM | POA: Insufficient documentation

## 2023-08-11 DIAGNOSIS — N924 Excessive bleeding in the premenopausal period: Secondary | ICD-10-CM | POA: Insufficient documentation

## 2023-08-11 DIAGNOSIS — D509 Iron deficiency anemia, unspecified: Secondary | ICD-10-CM | POA: Insufficient documentation

## 2023-08-11 LAB — FERRITIN: Ferritin: 4 ng/mL — ABNORMAL LOW (ref 11–307)

## 2023-08-11 LAB — COMPREHENSIVE METABOLIC PANEL WITH GFR
ALT: 8 U/L (ref 0–44)
AST: 14 U/L — ABNORMAL LOW (ref 15–41)
Albumin: 3.2 g/dL — ABNORMAL LOW (ref 3.5–5.0)
Alkaline Phosphatase: 98 U/L (ref 38–126)
Anion gap: 10 (ref 5–15)
BUN: 12 mg/dL (ref 6–20)
CO2: 25 mmol/L (ref 22–32)
Calcium: 9 mg/dL (ref 8.9–10.3)
Chloride: 104 mmol/L (ref 98–111)
Creatinine, Ser: 0.98 mg/dL (ref 0.44–1.00)
GFR, Estimated: >60 mL/min (ref >60)
Glucose, Bld: 97 mg/dL (ref 70–99)
Potassium: 3.9 mmol/L (ref 3.5–5.1)
Sodium: 139 mmol/L (ref 135–145)
Total Bilirubin: 0.7 mg/dL (ref 0.0–1.2)
Total Protein: 8 g/dL (ref 6.5–8.1)

## 2023-08-11 LAB — CBC WITH DIFFERENTIAL/PLATELET
Abs Immature Granulocytes: 0.02 10*3/uL (ref 0.00–0.07)
Basophils Absolute: 0 10*3/uL (ref 0.0–0.1)
Basophils Relative: 0 %
Eosinophils Absolute: 0.1 10*3/uL (ref 0.0–0.5)
Eosinophils Relative: 1 %
HCT: 28.9 % — ABNORMAL LOW (ref 36.0–46.0)
Hemoglobin: 7.3 g/dL — ABNORMAL LOW (ref 12.0–15.0)
Immature Granulocytes: 0 %
Lymphocytes Relative: 23 %
Lymphs Abs: 1.2 10*3/uL (ref 0.7–4.0)
MCH: 17.4 pg — ABNORMAL LOW (ref 26.0–34.0)
MCHC: 25.3 g/dL — ABNORMAL LOW (ref 30.0–36.0)
MCV: 68.8 fL — ABNORMAL LOW (ref 80.0–100.0)
Monocytes Absolute: 0.5 10*3/uL (ref 0.1–1.0)
Monocytes Relative: 10 %
Neutro Abs: 3.4 10*3/uL (ref 1.7–7.7)
Neutrophils Relative %: 66 %
Platelets: 255 10*3/uL (ref 150–400)
RBC: 4.2 MIL/uL (ref 3.87–5.11)
RDW: 21.2 % — ABNORMAL HIGH (ref 11.5–15.5)
WBC: 5.3 10*3/uL (ref 4.0–10.5)
nRBC: 0 % (ref 0.0–0.2)

## 2023-08-11 LAB — PROTIME-INR
INR: 1.1 (ref 0.8–1.2)
Prothrombin Time: 14 s (ref 11.4–15.2)

## 2023-08-11 LAB — APTT: aPTT: 34 s (ref 24–36)

## 2023-08-11 LAB — IRON AND TIBC
Iron: 17 ug/dL — ABNORMAL LOW (ref 28–170)
Saturation Ratios: 5 % — ABNORMAL LOW (ref 10.4–31.8)
TIBC: 363 ug/dL (ref 250–450)
UIBC: 346 ug/dL

## 2023-08-11 LAB — PREPARE RBC (CROSSMATCH)

## 2023-08-11 LAB — VITAMIN B12: Vitamin B-12: 257 pg/mL (ref 180–914)

## 2023-08-11 LAB — FOLATE: Folate: 7.6 ng/mL (ref >5.9)

## 2023-08-11 MED ORDER — SODIUM CHLORIDE 0.9% IV SOLUTION: Freq: Once | INTRAVENOUS | Status: AC

## 2023-08-11 NOTE — ED Triage Notes (Signed)
 Pt reports having blood work yesterday which resulted a Hgb 7.7.  Hx of anemia.  Pt denies any complaints.  Sts she is always SOB w/ exertion.

## 2023-08-11 NOTE — Discharge Instructions (Signed)
 Return for any problem.  ?

## 2023-08-11 NOTE — ED Provider Notes (Signed)
 Patient has completed transfusion - patient now appropriate for discharge.       Beth Carte, MD 08/11/23 2150

## 2023-08-11 NOTE — ED Provider Notes (Signed)
 [place] DE[facility] AT [facility] Provider Note   CSN: [id number] Arrival date & time: 08/11/23  1433     History  Chief Complaint  Patient presents with   Abnormal Lab    Beth Pierce is a 63 y.o. female with PMH as listed below who presents with low hgb.  Pt reports having blood work yesterday which resulted a Hgb 7.7.  Hx of anemia and has required blood transfusions in the past.  Pt denies any complaints.  Sts she is always SOB w/ exertion and it is not any worse than normal.  She does complain of heavy vaginal bleeding for which she was recently told she is going through menopause but she is not currently having any vaginal bleeding.  Denies any abdominal pain, fever/chills, urinary symptoms, rectal bleeding.  Does not take any blood thinners.  No bleeding from anywhere.   Past Medical History:  Diagnosis Date   Anemia    Anxiety    Arthritis    knees, hands   Bacterial vaginosis    GERD (gastroesophageal reflux disease)    Headache(784.0)    otc med prn   History of blood transfusion 09/2011   2 units at [facility]   Hypertension    Mental disorder    Shortness of breath    w/cold weather - used inhaler prn       Home Medications Prior to Admission medications   Medication Sig Start Date End Date Taking? Authorizing Provider  albuterol  (PROVENTIL  HFA;VENTOLIN  HFA) 108 (90 BASE) MCG/ACT inhaler Inhale 2 puffs into the lungs every 6 (six) hours as needed for wheezing.    [provider]  amLODipine -valsartan  (EXFORGE ) 10-320 MG per tablet Take 1 tablet by mouth daily.    [provider]  budesonide -formoterol  (SYMBICORT ) 160-4.5 MCG/ACT inhaler Inhale 2 puffs into the lungs 2 (two) times daily. Patient taking differently: Inhale 2 puffs into the lungs 2 (two) times daily as needed (shortness of breath). 04/04/14   Hongalgi, Anand D, MD  carvedilol  (COREG ) 6.25 MG tablet Take 6.25 mg by mouth 2 (two) times daily. 06/17/21   [provider]  diphenhydrAMINE (BENADRYL) 25 MG tablet Take 25 mg by mouth every 6 (six) hours as needed for allergies.    [provider]  esomeprazole (NEXIUM) 40 MG capsule Take 40 mg by mouth daily.     [provider]  ferrous sulfate  325 (65 FE) MG tablet Take 325 mg by mouth 2 (two) times daily with a meal.    [provider]  hydrochlorothiazide  (HYDRODIURIL ) 25 MG tablet Take 25 mg by mouth daily.    [provider]  traZODone  (DESYREL ) 50 MG tablet Take 50 mg by mouth at bedtime as needed for sleep.     [provider]      Allergies    Egg-derived products and Lactose intolerance (gi)    Review of Systems   Review of Systems A 10 point review of systems was performed and is negative unless otherwise reported in HPI.  Physical Exam Updated Vital Signs BP (!) 222/86 (BP Location: Left Arm) Comment: Pt didn't take medication today.  Pulse 92   Temp 98.8 F (37.1 C) (Oral)   Resp 20   Ht 5\' 3"  (1.6 m)   Wt (!) 151 kg   LMP 08/15/2011   SpO2 100%   BMI 58.99 kg/m  Physical Exam General: Normal appearing female, lying in bed.  HEENT: PERRLA, Sclera anicteric, MMM, trachea midline.  Cardiology: RRR, no murmurs/rubs/gallops.  Resp: Normal respiratory rate and effort. CTAB, no wheezes, rhonchi, crackles.  Abd: Soft, non-tender, non-distended. No rebound tenderness or guarding.  GU: Deferred. MSK: No peripheral edema or signs of trauma. Extremities without deformity or TTP. No cyanosis or clubbing. Skin: warm, dry.  Neuro: A&Ox4, CNs II-XII grossly intact. MAEs. Sensation grossly intact.  Psych: Normal mood and affect.   ED Results / Procedures / Treatments   Labs (all labs ordered are listed, but only abnormal results are displayed) Labs Reviewed  CBC WITH DIFFERENTIAL/PLATELET - Abnormal; Notable for the following components:      Result Value   Hemoglobin 7.3 (*)    HCT 28.9 (*)    MCV 68.8 (*)    MCH 17.4 (*)    MCHC  25.3 (*)    RDW 21.2 (*)    All other components within normal limits  COMPREHENSIVE METABOLIC PANEL WITH GFR - Abnormal; Notable for the following components:   Albumin 3.2 (*)    AST 14 (*)    All other components within normal limits  IRON  AND TIBC - Abnormal; Notable for the following components:   Iron  17 (*)    Saturation Ratios 5 (*)    All other components within normal limits  FERRITIN - Abnormal; Notable for the following components:   Ferritin 4 (*)    All other components within normal limits  PROTIME-INR  APTT  VITAMIN B12  FOLATE  TYPE AND SCREEN  PREPARE RBC (CROSSMATCH)  BLOOD TRANSFUSION REPORT - SCANNED   Narrative:    Ordered by an unspecified provider.    EKG None  Radiology No results found.  Procedures Procedures    Medications Ordered in ED Medications  0.9 %  sodium chloride  infusion (Manually program via Guardrails IV Fluids) (0 mLs Intravenous Stopped 08/11/23 2145)    ED Course/ Medical Decision Making/ A&P                          Medical Decision Making Amount and/or Complexity of Data Reviewed Labs: ordered. Decision-making details documented in ED Course.  Risk Prescription drug management.    This patient presents to the ED for concern of anemia, this involves an extensive number of treatment options, and is a complaint that carries with it a high risk of complications and morbidity.  I considered the following differential and admission for this acute, potentially life threatening condition.   MDM:    Patient with hemoglobin 7.3 on retest today with no active bleeding, no vaginal bleeding, no blood thinners.  Patient feels well.  She always feels short of breath with exertion and it is not worse than normal but technically she is symptomatic and we will give her 1 unit of packed red blood cells today.  She reports understanding and can likely be discharged after the blood transfusion.  She otherwise feels in her normal state of  health.  Clinical Course as of 08/17/23 1255  Wed Aug 11, 2023  1642 Comprehensive metabolic panel(!) Unremarkable in the context of this patient's presentation  [HN]  1719 Hemoglobin(!): 7.3 Reports no ongoing vaginal bleeding. [HN]  1719 Blood ordered. 1U PRBCs. Signed out to oncoming provider Dr. Reba Camper. [HN]    Clinical Course User Index [HN] Merdis Stalling, MD    Labs: I Ordered, and personally interpreted labs.  The pertinent results include: Those listed above  Additional history obtained from chart review, daughter at bedside.  Social Determinants of Health:  lives independently  Disposition:  Patient is signed out to the oncoming ED physician Dr.Messick who is made aware of her history, presentation, exam, workup, and plan.    Co morbidities that complicate the patient evaluation  Past Medical History:  Diagnosis Date   Anemia    Anxiety    Arthritis    knees, hands   Bacterial vaginosis    GERD (gastroesophageal reflux disease)    Headache(784.0)    otc med prn   History of blood transfusion 09/2011   2 units at [facility]   Hypertension    Mental disorder    Shortness of breath    w/cold weather - used inhaler prn     Medicines No orders of the defined types were placed in this encounter.   I have reviewed the patients home medicines and have made adjustments as needed  Problem List / ED Course: Problem List Items Addressed This Visit   None Visit Diagnoses       Symptomatic anemia    -  Primary                   This note was created using dictation software, which may contain spelling or grammatical errors.    Merdis Stalling, MD 08/17/23 [phone]

## 2023-08-12 LAB — TYPE AND SCREEN
ABO/RH(D): O POS
Antibody Screen: NEGATIVE
Unit division: 0

## 2023-08-12 LAB — BPAM RBC
Blood Product Expiration Date: 202507072359
ISSUE DATE / TIME: 202506041934
Unit Type and Rh: 5100

## 2023-09-16 ENCOUNTER — Telehealth: Payer: Self-pay

## 2023-09-16 NOTE — Telephone Encounter (Signed)
 7/10 called patient after received voicemail concerning a referral from Dr. Jacquiline office.  No referral received at this time. Left voicemail for call back.

## 2024-01-25 NOTE — Progress Notes (Signed)
 Beth Pierce                                          MRN: [id number]   01/25/2024   The [facility] Quality Team Specialist reviewed this patient medical record for the purposes of chart review for care gap closure. The following were reviewed: chart review for care gap closure-kidney health evaluation for diabetes:eGFR  and uACR.    [facility] Quality Team
